# Patient Record
Sex: Male | Born: 1960 | Race: White | Hispanic: No | Marital: Married | Smoking: Former smoker
Health system: Southern US, Community
[De-identification: ages and names within clinical notes are randomized; demographics above are authoritative.]

## PROBLEM LIST (undated history)

## (undated) DIAGNOSIS — N289 Disorder of kidney and ureter, unspecified: Secondary | ICD-10-CM

## (undated) DIAGNOSIS — F1010 Alcohol abuse, uncomplicated: Secondary | ICD-10-CM

## (undated) DIAGNOSIS — I10 Essential (primary) hypertension: Secondary | ICD-10-CM

## (undated) DIAGNOSIS — K859 Acute pancreatitis without necrosis or infection, unspecified: Secondary | ICD-10-CM

## (undated) DIAGNOSIS — J189 Pneumonia, unspecified organism: Secondary | ICD-10-CM

## (undated) HISTORY — PX: OTHER SURGICAL HISTORY: SHX169

## (undated) HISTORY — PX: LITHOTRIPSY: SUR834

---

## 2000-04-08 ENCOUNTER — Ambulatory Visit (HOSPITAL_BASED_OUTPATIENT_CLINIC_OR_DEPARTMENT_OTHER): Admission: RE | Admit: 2000-04-08 | Discharge: 2000-04-08 | Payer: Self-pay | Admitting: Orthopedic Surgery

## 2001-08-17 ENCOUNTER — Ambulatory Visit (HOSPITAL_BASED_OUTPATIENT_CLINIC_OR_DEPARTMENT_OTHER): Admission: RE | Admit: 2001-08-17 | Discharge: 2001-08-17 | Payer: Self-pay | Admitting: Orthopedic Surgery

## 2003-08-23 ENCOUNTER — Ambulatory Visit (HOSPITAL_BASED_OUTPATIENT_CLINIC_OR_DEPARTMENT_OTHER): Admission: RE | Admit: 2003-08-23 | Discharge: 2003-08-23 | Payer: Self-pay | Admitting: Orthopedic Surgery

## 2011-08-03 ENCOUNTER — Inpatient Hospital Stay (HOSPITAL_COMMUNITY)
Admission: EM | Admit: 2011-08-03 | Discharge: 2011-08-07 | DRG: 204 | Disposition: A | Discharge status: Home / Self Care | Payer: BC Managed Care – PPO | Attending: Internal Medicine | Admitting: Internal Medicine

## 2011-08-03 ENCOUNTER — Encounter: Payer: Self-pay | Admitting: *Deleted

## 2011-08-03 DIAGNOSIS — N200 Calculus of kidney: Secondary | ICD-10-CM | POA: Diagnosis present

## 2011-08-03 DIAGNOSIS — Z23 Encounter for immunization: Secondary | ICD-10-CM

## 2011-08-03 DIAGNOSIS — F1021 Alcohol dependence, in remission: Secondary | ICD-10-CM | POA: Diagnosis present

## 2011-08-03 DIAGNOSIS — K859 Acute pancreatitis without necrosis or infection, unspecified: Principal | ICD-10-CM | POA: Diagnosis present

## 2011-08-03 DIAGNOSIS — R109 Unspecified abdominal pain: Secondary | ICD-10-CM | POA: Diagnosis present

## 2011-08-03 HISTORY — DX: Acute pancreatitis without necrosis or infection, unspecified: K85.90

## 2011-08-03 HISTORY — DX: Pneumonia, unspecified organism: J18.9

## 2011-08-03 HISTORY — DX: Disorder of kidney and ureter, unspecified: N28.9

## 2011-08-03 HISTORY — DX: Alcohol abuse, uncomplicated: F10.10

## 2011-08-03 HISTORY — DX: Essential (primary) hypertension: I10

## 2011-08-03 LAB — URINALYSIS, ROUTINE W REFLEX MICROSCOPIC
Bilirubin Urine: NEGATIVE
Glucose, UA: NEGATIVE mg/dL
Hgb urine dipstick: NEGATIVE
Protein, ur: NEGATIVE mg/dL
Urobilinogen, UA: 0.2 mg/dL (ref 0.0–1.0)

## 2011-08-03 LAB — DIFFERENTIAL
Basophils Absolute: 0.1 10*3/uL (ref 0.0–0.1)
Eosinophils Absolute: 0.1 10*3/uL (ref 0.0–0.7)
Eosinophils Relative: 1 % (ref 0–5)
Lymphs Abs: 2.2 10*3/uL (ref 0.7–4.0)
Monocytes Absolute: 0.6 10*3/uL (ref 0.1–1.0)

## 2011-08-03 LAB — BASIC METABOLIC PANEL
CO2: 26 mEq/L (ref 19–32)
Calcium: 9.3 mg/dL (ref 8.4–10.5)
Creatinine, Ser: 0.64 mg/dL (ref 0.50–1.35)
GFR calc non Af Amer: >90 mL/min (ref >90)
Glucose, Bld: 117 mg/dL — ABNORMAL HIGH (ref 70–99)
Sodium: 138 mEq/L (ref 135–145)

## 2011-08-03 LAB — LIPASE, BLOOD: Lipase: 97 U/L — ABNORMAL HIGH (ref 11–59)

## 2011-08-03 LAB — CBC
HCT: 37.7 % — ABNORMAL LOW (ref 39.0–52.0)
MCH: 35.1 pg — ABNORMAL HIGH (ref 26.0–34.0)
MCV: 105 fL — ABNORMAL HIGH (ref 78.0–100.0)
Platelets: 613 10*3/uL — ABNORMAL HIGH (ref 150–400)
RDW: 13 % (ref 11.5–15.5)

## 2011-08-03 LAB — HEPATIC FUNCTION PANEL
Albumin: 2.4 g/dL — ABNORMAL LOW (ref 3.5–5.2)
Alkaline Phosphatase: 114 U/L (ref 39–117)
Bilirubin, Direct: <0.1 mg/dL (ref 0.0–0.3)
Total Bilirubin: 0.1 mg/dL — ABNORMAL LOW (ref 0.3–1.2)

## 2011-08-03 LAB — ETHANOL: Alcohol, Ethyl (B): <11 mg/dL (ref 0–11)

## 2011-08-03 MED ORDER — THIAMINE HCL 100 MG/ML IJ SOLN
Freq: Once | INTRAVENOUS | Status: AC
  Administered 2011-08-04: 01:00:00 via INTRAVENOUS
  Filled 2011-08-03: qty 1000

## 2011-08-03 MED ORDER — SODIUM CHLORIDE 0.9 % IV BOLUS (SEPSIS)
1000.0000 mL | Freq: Once | INTRAVENOUS | Status: AC
  Administered 2011-08-03: 1000 mL via INTRAVENOUS

## 2011-08-03 MED ORDER — ENOXAPARIN SODIUM 40 MG/0.4ML ~~LOC~~ SOLN
40.0000 mg | Freq: Every day | SUBCUTANEOUS | Status: DC
  Administered 2011-08-04 – 2011-08-07 (×4): 40 mg via SUBCUTANEOUS
  Filled 2011-08-03 (×4): qty 0.4

## 2011-08-03 MED ORDER — HYDROMORPHONE HCL 1 MG/ML IJ SOLN
1.0000 mg | INTRAMUSCULAR | Status: DC | PRN
  Administered 2011-08-04 (×2): 2 mg via INTRAVENOUS
  Administered 2011-08-04: 1 mg via INTRAVENOUS
  Administered 2011-08-04 – 2011-08-05 (×6): 2 mg via INTRAVENOUS
  Administered 2011-08-05: 1 mg via INTRAVENOUS
  Administered 2011-08-06: 2 mg via INTRAVENOUS
  Administered 2011-08-06: 1 mg via INTRAVENOUS
  Administered 2011-08-06: 2 mg via INTRAVENOUS
  Administered 2011-08-07: 1 mg via INTRAVENOUS
  Administered 2011-08-07: 2 mg via INTRAVENOUS
  Administered 2011-08-07: 1 mg via INTRAVENOUS
  Filled 2011-08-03: qty 1
  Filled 2011-08-03 (×3): qty 2
  Filled 2011-08-03: qty 1
  Filled 2011-08-03: qty 2
  Filled 2011-08-03: qty 1
  Filled 2011-08-03: qty 2
  Filled 2011-08-03: qty 1
  Filled 2011-08-03 (×3): qty 2
  Filled 2011-08-03: qty 1
  Filled 2011-08-03 (×3): qty 2

## 2011-08-03 MED ORDER — SODIUM CHLORIDE 0.9 % IJ SOLN
INTRAMUSCULAR | Status: AC
  Administered 2011-08-03: 3 mL
  Filled 2011-08-03: qty 3

## 2011-08-03 MED ORDER — M.V.I. ADULT IV INJ
INJECTION | Freq: Once | INTRAVENOUS | Status: DC
  Administered 2011-08-03: 22:00:00 via INTRAVENOUS
  Filled 2011-08-03: qty 1000

## 2011-08-03 MED ORDER — DEXTROSE-NACL 5-0.9 % IV SOLN
INTRAVENOUS | Status: DC
  Administered 2011-08-04: via INTRAVENOUS

## 2011-08-03 MED ORDER — HYDROMORPHONE HCL 1 MG/ML IJ SOLN
1.0000 mg | Freq: Once | INTRAMUSCULAR | Status: AC
  Administered 2011-08-03: 1 mg via INTRAVENOUS
  Filled 2011-08-03: qty 1

## 2011-08-03 MED ORDER — SODIUM CHLORIDE 0.9 % IV SOLN
1.5000 g | Freq: Three times a day (TID) | INTRAVENOUS | Status: DC
  Administered 2011-08-04 (×2): 1.5 g via INTRAVENOUS
  Filled 2011-08-03 (×5): qty 1.5

## 2011-08-03 MED ORDER — PANTOPRAZOLE SODIUM 40 MG IV SOLR
40.0000 mg | Freq: Every day | INTRAVENOUS | Status: DC
  Administered 2011-08-04 – 2011-08-06 (×4): 40 mg via INTRAVENOUS
  Filled 2011-08-03 (×4): qty 40

## 2011-08-03 MED ORDER — SODIUM CHLORIDE 0.9 % IV SOLN
INTRAVENOUS | Status: DC
Start: 2011-08-03 — End: 2011-08-03
  Administered 2011-08-03: 20:00:00 via INTRAVENOUS

## 2011-08-03 MED ORDER — ONDANSETRON HCL 4 MG/2ML IJ SOLN
4.0000 mg | Freq: Once | INTRAMUSCULAR | Status: AC
  Administered 2011-08-03: 4 mg via INTRAVENOUS
  Filled 2011-08-03: qty 2

## 2011-08-03 MED ORDER — KETOROLAC TROMETHAMINE 30 MG/ML IJ SOLN
30.0000 mg | Freq: Once | INTRAMUSCULAR | Status: AC
  Administered 2011-08-03: 30 mg via INTRAVENOUS
  Filled 2011-08-03: qty 1

## 2011-08-03 NOTE — ED Notes (Signed)
Pt's sister states that pt was admitted x 3 wks ago for alcohol abuse and withdrawal.  Had been having hallucinations for which he is now taking medications for.

## 2011-08-03 NOTE — ED Notes (Signed)
Dx with kidney stone x 3-4 wks ago.  C/o left sided abd pain radiating to left side of bag, states thinks it's the kidney stone, states pain became worse today.  Denies GU sx, denies n/v/d.

## 2011-08-03 NOTE — ED Provider Notes (Signed)
History     CSN: [id number] Arrival date & time: 08/03/2011  6:54 PM  Chief Complaint  Patient presents with  . Abdominal Pain    (Consider location/radiation/quality/duration/timing/severity/associated sxs/prior treatment) HPI:  Patient recently discharged from hospital in [place] with pancreatitis, kidney stones, urinary infection. To be a heavy drinker but has not drank in a couple weeks. Level V caveat for urgent need for intervention. Anes of pain in left flank with radiation to the left abdomen and epigastric area. No fever or chills or dysuria  Past Medical History  Diagnosis Date  . Renal disorder     kidney stones  . Hypertension   . Pneumonia   . Pancreatitis   . Alcohol abuse     Past Surgical History  Procedure Date  . Lithotripsy   . Carpel tunnel     No family history on file.  History  Substance Use Topics  . Smoking status: Former Games developer  . Smokeless tobacco: Not on file  . Alcohol Use: No      Review of Systems  Unable to perform ROS: Other    Allergies  Review of patient's allergies indicates no known allergies.  Home Medications   Current Outpatient Rx  Name Route Sig Dispense Refill  . ALLOPURINOL 300 MG PO TABS Oral Take 300 mg by mouth daily.      . AMOXICILLIN-POT CLAVULANATE 1000-62.5 MG PO TB12 Oral Take 2 tablets by mouth 2 (two) times daily.      . CHLORDIAZEPOXIDE HCL 25 MG PO CAPS Oral Take 25 mg by mouth at bedtime.      Marland Kitchen LISINOPRIL-HYDROCHLOROTHIAZIDE 20-25 MG PO TABS Oral Take 2 tablets by mouth daily.      Marland Kitchen LORATADINE 10 MG PO TABS Oral Take 10 mg by mouth daily. For allergies     . METOPROLOL TARTRATE 25 MG PO TABS Oral Take 25 mg by mouth 2 (two) times daily.      Marland Kitchen OMEPRAZOLE 20 MG PO CPDR Oral Take 20 mg by mouth daily. On an empty stomach     . POTASSIUM CHLORIDE 10 MEQ PO TBCR Oral Take 10 mEq by mouth daily.      Marland Kitchen PROMETHAZINE HCL 25 MG PO TABS Oral Take 25 mg by mouth every 6 (six) hours as needed. For  nausea and vomiting: Take one tablet every 6 to 8 hours as needed for nausea and vomiting       BP 120/88  Pulse 78  Temp(Src) 98.2 F (36.8 C) (Oral)  Resp 16  Ht 5\' 10"  (1.778 m)  Wt 250 lb (113.399 kg)  BMI 35.87 kg/m2  SpO2 99%  Physical Exam  Nursing note and vitals reviewed. Constitutional: He is oriented to person, place, and time. He appears well-developed and well-nourished.  HENT:  Head: Normocephalic and atraumatic.  Eyes: Conjunctivae and EOM are normal. Pupils are equal, round, and reactive to light.  Neck: Normal range of motion. Neck supple.  Cardiovascular: Normal rate and regular rhythm.   Pulmonary/Chest: Effort normal and breath sounds normal.  Abdominal: Soft. Bowel sounds are normal.       Minimal tenderness left lower cautery and epigastrium  Genitourinary:       Minimal tenderness left flank  Musculoskeletal: Normal range of motion.  Neurological: He is alert and oriented to person, place, and time.  Skin: Skin is warm and dry.  Psychiatric: He has a normal mood and affect.    ED Course  Procedures (including critical care time)  Labs Reviewed  URINALYSIS, ROUTINE W REFLEX MICROSCOPIC - Abnormal; Notable for the following:    Color, Urine AMBER (*) BIOCHEMICALS MAY BE AFFECTED BY COLOR   All other components within normal limits  CBC - Abnormal; Notable for the following:    WBC 11.5 (*)    RBC 3.59 (*)    Hemoglobin 12.6 (*)    HCT 37.7 (*)    MCV 105.0 (*)    MCH 35.1 (*)    Platelets 613 (*)    All other components within normal limits  DIFFERENTIAL - Abnormal; Notable for the following:    Neutro Abs 8.5 (*)    All other components within normal limits  BASIC METABOLIC PANEL - Abnormal; Notable for the following:    Glucose, Bld 117 (*)    All other components within normal limits  HEPATIC FUNCTION PANEL - Abnormal; Notable for the following:    Albumin 2.4 (*)    AST 113 (*)    ALT 89 (*)    Total Bilirubin 0.1 (*)    All other  components within normal limits  LIPASE, BLOOD - Abnormal; Notable for the following:    Lipase 97 (*)    All other components within normal limits  ETHANOL   No results found.   1. Pancreatitis       MDM  Lipase is elevated. Patient has persistent pain. I opted not to do a CT scan tonight because of excessive radiation exposure. Will admit for pain control and hydration        Donnetta Hutching, MD 08/03/11 2155

## 2011-08-04 ENCOUNTER — Encounter (HOSPITAL_COMMUNITY): Payer: Self-pay

## 2011-08-04 LAB — CREATININE, SERUM: Creatinine, Ser: 0.65 mg/dL (ref 0.50–1.35)

## 2011-08-04 LAB — LIPASE, BLOOD: Lipase: 81 U/L — ABNORMAL HIGH (ref 11–59)

## 2011-08-04 LAB — CBC
Hemoglobin: 12.1 g/dL — ABNORMAL LOW (ref 13.0–17.0)
MCH: 35.7 pg — ABNORMAL HIGH (ref 26.0–34.0)
MCH: 34.8 pg — ABNORMAL HIGH (ref 26.0–34.0)
MCHC: 34 g/dL (ref 30.0–36.0)
MCHC: 32.4 g/dL (ref 30.0–36.0)
Platelets: 520 10*3/uL — ABNORMAL HIGH (ref 150–400)
Platelets: 540 10*3/uL — ABNORMAL HIGH (ref 150–400)
RDW: 13 % (ref 11.5–15.5)
RDW: 13.2 % (ref 11.5–15.5)

## 2011-08-04 LAB — BASIC METABOLIC PANEL
BUN: 18 mg/dL (ref 6–23)
Calcium: 9.1 mg/dL (ref 8.4–10.5)
Creatinine, Ser: 0.61 mg/dL (ref 0.50–1.35)
GFR calc non Af Amer: >90 mL/min (ref >90)
Glucose, Bld: 107 mg/dL — ABNORMAL HIGH (ref 70–99)

## 2011-08-04 MED ORDER — FOLIC ACID 5 MG/ML IJ SOLN
INTRAMUSCULAR | Status: AC
  Filled 2011-08-04: qty 0.2

## 2011-08-04 MED ORDER — INFLUENZA VAC TYP A&B SURF ANT IM INJ
0.5000 mL | INJECTION | Freq: Once | INTRAMUSCULAR | Status: DC
  Filled 2011-08-04: qty 0.5

## 2011-08-04 MED ORDER — SODIUM CHLORIDE 0.9 % IJ SOLN
INTRAMUSCULAR | Status: AC
  Administered 2011-08-04: 13:00:00
  Filled 2011-08-04: qty 10

## 2011-08-04 MED ORDER — THIAMINE HCL 100 MG/ML IJ SOLN
INTRAMUSCULAR | Status: AC
  Filled 2011-08-04: qty 2

## 2011-08-04 MED ORDER — KCL IN DEXTROSE-NACL 20-5-0.9 MEQ/L-%-% IV SOLN
INTRAVENOUS | Status: DC
  Administered 2011-08-04: 1000 mL via INTRAVENOUS
  Administered 2011-08-04 – 2011-08-07 (×5): via INTRAVENOUS

## 2011-08-04 MED ORDER — SODIUM CHLORIDE 0.9 % IJ SOLN
INTRAMUSCULAR | Status: AC
  Filled 2011-08-04: qty 10

## 2011-08-04 MED ORDER — INFLUENZA VIRUS VACC SPLIT PF IM SUSP
0.5000 mL | Freq: Once | INTRAMUSCULAR | Status: AC
  Administered 2011-08-05: 0.5 mL via INTRAMUSCULAR
  Filled 2011-08-04: qty 0.5

## 2011-08-04 MED ORDER — SODIUM CHLORIDE 0.9 % IJ SOLN
INTRAMUSCULAR | Status: AC
  Administered 2011-08-04: 10 mL
  Filled 2011-08-04: qty 10

## 2011-08-04 MED ORDER — M.V.I. ADULT IV INJ
INJECTION | INTRAVENOUS | Status: AC
  Filled 2011-08-04: qty 10

## 2011-08-04 MED ORDER — SODIUM CHLORIDE 0.9 % IV SOLN
INTRAVENOUS | Status: AC
  Filled 2011-08-04: qty 1.5

## 2011-08-04 MED ORDER — SODIUM CHLORIDE 0.9 % IJ SOLN
INTRAMUSCULAR | Status: AC
  Administered 2011-08-04: 02:00:00
  Filled 2011-08-04: qty 10

## 2011-08-04 NOTE — Progress Notes (Signed)
Skin assessment done by C Kassady Laboy RN and Nena Polio RN pt was found to have old bruise to left arm  And small scar to left flank  Upper rt arm scar is noted

## 2011-08-04 NOTE — H&P (Signed)
PCP: None.     Chief Complaint: Abdominal pain   HPI: Edwin Thompson is an 50 y.o. male with history of alcoholism along with a history of chronic recurrent pancreatitis, presents to the emergency room at [facility] complaining of abdominal pain and right flank pain. He denied any nausea, vomiting,  black stool,  bloody stool,  fever or chills. He was recently admitted to the hospital in [place] for pancreatitis,  kidney stone,  and UTI.   He adamantly denied that he had been drinking for at least 20 days.  He has no chest pain shortness of breath headache or any other symptomology. In the emergency room, he was given Dilaudid but because of the persistent of his pain hospitalists was asked to admit him.  Past Medical History  Diagnosis Date  . Renal disorder     kidney stones  . Hypertension   . Pneumonia   . Pancreatitis   . Alcohol abuse     Past Surgical History  Procedure Date  . Lithotripsy   . Carpel tunnel     Medications:  HOME MEDS: Prior to Admission medications   Medication Sig Start Date End Date Taking? Authorizing Provider  allopurinol (ZYLOPRIM) 300 MG tablet Take 300 mg by mouth daily.     Yes Historical Provider, MD  amoxicillin-clavulanate (AUGMENTIN XR) 1000-62.5 MG per tablet Take 2 tablets by mouth 2 (two) times daily.     Yes Historical Provider, MD  chlordiazePOXIDE (LIBRIUM) 25 MG capsule Take 25 mg by mouth at bedtime.     Yes Historical Provider, MD  lisinopril-hydrochlorothiazide (PRINZIDE,ZESTORETIC) 20-25 MG per tablet Take 2 tablets by mouth daily.     Yes Historical Provider, MD  loratadine (CLARITIN) 10 MG tablet Take 10 mg by mouth daily. For allergies    Yes Historical Provider, MD  metoprolol tartrate (LOPRESSOR) 25 MG tablet Take 25 mg by mouth 2 (two) times daily.     Yes Historical Provider, MD  omeprazole (PRILOSEC) 20 MG capsule Take 20 mg by mouth daily. On an empty stomach    Yes Historical Provider, MD  potassium chloride  (KLOR-CON) 10 MEQ CR tablet Take 10 mEq by mouth daily.     Yes Historical Provider, MD  promethazine (PHENERGAN) 25 MG tablet Take 25 mg by mouth every 6 (six) hours as needed. For nausea and vomiting: Take one tablet every 6 to 8 hours as needed for nausea and vomiting     Historical Provider, MD    PRIOR TO [facility] Medications Prior to Admission  Medication Dose Route Frequency Provider Last Rate Last Dose  . ampicillin-sulbactam (UNASYN) 1.5 g in sodium chloride 0.9 % 50 mL IVPB  1.5 g Intravenous Q8H Chritopher Coster   1.5 g at 08/04/11 0028  . dextrose 5 % and 0.9 % NaCl with KCl 20 mEq/L infusion   Intravenous Continuous [place] Siren      . dextrose 5 % and 0.9% NaCl 1,000 mL with thiamine 100 mg, folic acid 1 mg, multivitamins adult 10 mL infusion   Intravenous Once [place] Siren      . enoxaparin (LOVENOX) injection 40 mg  40 mg Subcutaneous Daily [place] Siren      . HYDROmorphone (DILAUDID) injection 1 mg  1 mg Intravenous Once Donnetta Hutching, MD   1 mg at 08/03/11 2019  . HYDROmorphone (DILAUDID) injection 1-2 mg  1-2 mg Intravenous Q3H PRN [place] Siren   1 mg at 08/04/11 0012  . ketorolac (TORADOL) 30 MG/ML  injection 30 mg  30 mg Intravenous Once Donnetta Hutching, MD   30 mg at 08/03/11 2019  . ondansetron (ZOFRAN) injection 4 mg  4 mg Intravenous Once Donnetta Hutching, MD   4 mg at 08/03/11 2019  . pantoprazole (PROTONIX) injection 40 mg  40 mg Intravenous QHS Houston Siren      . sodium chloride 0.9 % bolus 1,000 mL  1,000 mL Intravenous Once Donnetta Hutching, MD   1,000 mL at 08/03/11 2021  . sodium chloride 0.9 % bolus 1,000 mL  1,000 mL Intravenous Once Donnetta Hutching, MD   1,000 mL at 08/03/11 2021  . sodium chloride 0.9 % injection        3 mL at 08/03/11 2346  . DISCONTD: 0.9 %  sodium chloride infusion   Intravenous Continuous Donnetta Hutching, MD 125 mL/hr at 08/03/11 2015    . DISCONTD: dextrose 5 % and 0.9% NaCl 1,000 mL with potassium chloride 20 mEq infusion   Intravenous Continuous [place] Siren      . DISCONTD: sodium chloride  0.9 % 1,000 mL with thiamine 100 mg, folic acid 1 mg, multivitamins adult 10 mL infusion   Intravenous Once Donnetta Hutching, MD       No current outpatient prescriptions on file as of 08/04/2011.    Allergies:  No Known Allergies  Social History:   reports that he has quit smoking. He does not have any smokeless tobacco history on file. He reports that he does not drink alcohol or use illicit drugs.  Family History: No family history on file.  Rewiew of Systems:  The patient denies anorexia, fever, weight loss,, vision loss, decreased hearing, hoarseness, chest pain, syncope, dyspnea on exertion, peripheral edema, balance deficits, hemoptysis, melena, hematochezia, severe indigestion/heartburn, hematuria, incontinence, genital sores, muscle weakness, suspicious skin lesions, transient blindness, difficulty walking, depression, unusual weight change, abnormal bleeding, enlarged lymph nodes, angioedema, and breast masses.   Physical Exam: Filed Vitals:   08/03/11 1839 08/03/11 2141 08/03/11 2338  BP: 123/82 120/88 130/86  Pulse: 81 78 80  Temp: 98.2 F (36.8 C)  97.4 F (36.3 C)  TempSrc: Oral  Oral  Resp: 20 16 18   Height: 5\' 10"  (1.778 m)    Weight: 113.399 kg (250 lb)    SpO2: 98% 99% 94%   Blood pressure 130/86, pulse 80, temperature 97.4 F (36.3 C), temperature source Oral, resp. rate 18, height 5\' 10"  (1.778 m), weight 113.399 kg (250 lb), SpO2 94.00%.  GEN:  Pleasant person lying in the stretcher in no acute distress; cooperative with exam PSYCH: He is alert and oriented x4; does not appear anxious does not appear depressed; affect is normal HEENT: Mucous membranes pink and anicteric; PERRLA; EOM intact; no cervical lymphadenopathy nor thyromegaly or carotid bruit; no JVD; Breasts:: Not examined CHEST WALL: No tenderness CHEST: Normal respiration, clear to auscultation bilaterally HEART: Regular rate and rhythm; no murmurs rubs or gallops BACK: No kyphosis or scoliosis; no  CVA tenderness ABDOMEN: Obese, tender in the epigastic arear; no masses, no organomegaly, normal abdominal bowel sounds; no pannus; no intertriginous candida. Rectal Exam: Not done EXTREMITIES: No bone or joint deformity; age-appropriate arthropathy of the hands and knees; no edema; no ulcerations. Genitalia: not examined PULSES: 2+ and symmetric SKIN: Normal hydration no rash or ulceration CNS: Cranial nerves 2-12 grossly intact no focal neurologic deficit   Labs & Imaging Results for orders placed during the hospital encounter of 08/03/11 (from the past 48 hour(s))  URINALYSIS, ROUTINE W REFLEX MICROSCOPIC  Status: Abnormal   Collection Time   08/03/11  7:36 PM      Component Value Range Comment   Color, Urine AMBER (*) YELLOW  BIOCHEMICALS MAY BE AFFECTED BY COLOR   Appearance CLEAR  CLEAR     Specific Gravity, Urine 1.020  1.005 - 1.030     pH 6.0  5.0 - 8.0     Glucose, UA NEGATIVE  NEGATIVE (mg/dL)    Hgb urine dipstick NEGATIVE  NEGATIVE     Bilirubin Urine NEGATIVE  NEGATIVE     Ketones, ur NEGATIVE  NEGATIVE (mg/dL)    Protein, ur NEGATIVE  NEGATIVE (mg/dL)    Urobilinogen, UA 0.2  0.0 - 1.0 (mg/dL)    Nitrite NEGATIVE  NEGATIVE     Leukocytes, UA NEGATIVE  NEGATIVE  MICROSCOPIC NOT DONE ON URINES WITH NEGATIVE PROTEIN, BLOOD, LEUKOCYTES, NITRITE, OR GLUCOSE <1000 mg/dL.  CBC     Status: Abnormal   Collection Time   08/03/11  7:45 PM      Component Value Range Comment   WBC 11.5 (*) 4.0 - 10.5 (K/uL)    RBC 3.59 (*) 4.22 - 5.81 (MIL/uL)    Hemoglobin 12.6 (*) 13.0 - 17.0 (g/dL)    HCT 16.1 (*) 09.6 - 52.0 (%)    MCV 105.0 (*) 78.0 - 100.0 (fL)    MCH 35.1 (*) 26.0 - 34.0 (pg)    MCHC 33.4  30.0 - 36.0 (g/dL)    RDW 04.5  40.9 - 81.1 (%)    Platelets 613 (*) 150 - 400 (K/uL)   DIFFERENTIAL     Status: Abnormal   Collection Time   08/03/11  7:45 PM      Component Value Range Comment   Neutrophils Relative 74  43 - 77 (%)    Neutro Abs 8.5 (*) 1.7 - 7.7 (K/uL)      Lymphocytes Relative 19  12 - 46 (%)    Lymphs Abs 2.2  0.7 - 4.0 (K/uL)    Monocytes Relative 5  3 - 12 (%)    Monocytes Absolute 0.6  0.1 - 1.0 (K/uL)    Eosinophils Relative 1  0 - 5 (%)    Eosinophils Absolute 0.1  0.0 - 0.7 (K/uL)    Basophils Relative 1  0 - 1 (%)    Basophils Absolute 0.1  0.0 - 0.1 (K/uL)   BASIC METABOLIC PANEL     Status: Abnormal   Collection Time   08/03/11  7:45 PM      Component Value Range Comment   Sodium 138  135 - 145 (mEq/L)    Potassium 4.0  3.5 - 5.1 (mEq/L)    Chloride 102  96 - 112 (mEq/L)    CO2 26  19 - 32 (mEq/L)    Glucose, Bld 117 (*) 70 - 99 (mg/dL)    BUN 15  6 - 23 (mg/dL)    Creatinine, Ser 9.14  0.50 - 1.35 (mg/dL)    Calcium 9.3  8.4 - 10.5 (mg/dL)    GFR calc non Af Amer >90  >90 (mL/min)    GFR calc Af Amer >90  >90 (mL/min)   HEPATIC FUNCTION PANEL     Status: Abnormal   Collection Time   08/03/11  8:08 PM      Component Value Range Comment   Total Protein 6.1  6.0 - 8.3 (g/dL)    Albumin 2.4 (*) 3.5 - 5.2 (g/dL)    AST 782 (*) 0 -  37 (U/L)    ALT 89 (*) 0 - 53 (U/L)    Alkaline Phosphatase 114  39 - 117 (U/L)    Total Bilirubin 0.1 (*) 0.3 - 1.2 (mg/dL)    Bilirubin, Direct <1.6  0.0 - 0.3 (mg/dL)    Indirect Bilirubin NOT CALCULATED  0.3 - 0.9 (mg/dL)   LIPASE, BLOOD     Status: Abnormal   Collection Time   08/03/11  8:08 PM      Component Value Range Comment   Lipase 97 (*) 11 - 59 (U/L)   ETHANOL     Status: Normal   Collection Time   08/03/11  8:08 PM      Component Value Range Comment   Alcohol, Ethyl (B) <11  0 - 11 (mg/dL)    No results found.    Assessment Present on Admission:  .Abdominal pain .Alcoholism in remission .Nephrolithiasis .Pancreatitis   PLAN: #1 abdominal pain: Will admit patient to the hospital. He was placed n.p.o.,   He be given intravenous fluid with dextrose along with potassium.  I will give him Dilaudid for pain control.  #2 alcoholism:   He is actually doing quite well  and has not drop for 20 days. We'll give him thiamine just to be sure, but he is at low risk for withdrawal.  #3 nephrolithiasis:   Will continue him on Unasyn for his presumed UTI.  #4 pancreatitis his pain level is now very high I suspect that with n.p.o. and lauded that he should do well. Will repeat another lipase in the morning. He is stable otherwise.  Other plans as per orders  Crislyn Willbanks 08/04/2011, 12:35 AM

## 2011-08-04 NOTE — Progress Notes (Signed)
Subjective: Feels better, abd pain improved, no nausea or vomitting  Objective: Vital signs in last 24 hours: Temp:  [97.4 F (36.3 C)-98.2 F (36.8 C)] 98 F (36.7 C) (10/16 1610) Pulse Rate:  [78-92] 92  (10/16 0638) Resp:  [16-20] 20  (10/16 0638) BP: (101-130)/(64-88) 101/64 mmHg (10/16 0638) SpO2:  [94 %-99 %] 94 % (10/16 0638) Weight:  [113.399 kg (250 lb)-116.03 kg (255 lb 12.8 oz)] 255 lb 12.8 oz (116.03 kg) (10/16 0300) Weight change:  Last BM Date: 08/03/11  Intake/Output from previous day: 10/15 0701 - 10/16 0700 In: 0  Out: 275 [Urine:275]     Physical Exam: General: Alert, awake, oriented x3, in no acute distress. HEENT: No bruits, no goiter. Heart: Regular rate and rhythm, without murmurs, rubs, gallops. Lungs: Clear to auscultation bilaterally. Abdomen: Soft, nontender, nondistended, positive bowel sounds. Extremities: No clubbing cyanosis or edema with positive pedal pulses. Neuro: Grossly intact, nonfocal.    Lab Results: Basic Metabolic Panel:  Basename 08/04/11 0557 08/04/11 0021 08/03/11 1945  NA 140 -- 138  K 4.2 -- 4.0  CL 104 -- 102  CO2 28 -- 26  GLUCOSE 107* -- 117*  BUN 18 -- 15  CREATININE 0.61 0.65 --  CALCIUM 9.1 -- 9.3  MG -- -- --  PHOS -- -- --   Liver Function Tests:  [facility] 08/03/11 2008  AST 113*  ALT 89*  ALKPHOS 114  BILITOT 0.1*  PROT 6.1  ALBUMIN 2.4*    Basename 08/04/11 0557 08/03/11 2008  LIPASE 81* 97*  AMYLASE -- --   No results found for this basename: AMMONIA:2 in the last 72 hours CBC:  Basename 08/04/11 0557 08/04/11 0021 08/03/11 1945  WBC 11.0* 10.7* --  NEUTROABS -- -- 8.5*  HGB 12.1* 11.7* --  HCT 37.3* 34.4* --  MCV 107.2* 104.9* --  PLT 540* 520* --   Cardiac Enzymes: No results found for this basename: CKTOTAL:3,CKMB:3,CKMBINDEX:3,TROPONINI:3 in the last 72 hours BNP: No results found for this basename: POCBNP:3 in the last 72 hours D-Dimer: No results found for this basename:  DDIMER:2 in the last 72 hours CBG: No results found for this basename: GLUCAP:6 in the last 72 hours Hemoglobin A1C: No results found for this basename: HGBA1C in the last 72 hours Fasting Lipid Panel: No results found for this basename: CHOL,HDL,LDLCALC,TRIG,CHOLHDL,LDLDIRECT in the last 72 hours Thyroid Function Tests: No results found for this basename: TSH,T4TOTAL,FREET4,T3FREE,THYROIDAB in the last 72 hours   Basename 08/03/11 2008  ETH <11   Studies/Results: No results found.  Medications: Scheduled Meds:   . ampicillin-sulbactam (UNASYN) IV  1.5 g Intravenous Q8H  . general admission iv infusion   Intravenous Once  . enoxaparin  40 mg Subcutaneous Daily  .  HYDROmorphone (DILAUDID) injection  1 mg Intravenous Once  . influenza  inactive virus vaccine  0.5 mL Intramuscular Once  . ketorolac  30 mg Intravenous Once  . ondansetron  4 mg Intravenous Once  . pantoprazole (PROTONIX) IV  40 mg Intravenous QHS  . sodium chloride  1,000 mL Intravenous Once  . sodium chloride  1,000 mL Intravenous Once  . sodium chloride      . sodium chloride      . sodium chloride      . DISCONTD: influenza (>/= 3 years) inactive virus vaccine  0.5 mL Intramuscular Once  . DISCONTD: general admission iv infusion   Intravenous Once   Continuous Infusions:   . dextrose 5 % and 0.9 % NaCl  with KCl 20 mEq/L 100 mL/hr at 08/04/11 0015  . DISCONTD: sodium chloride 125 mL/hr at 08/03/11 2015  . DISCONTD: dextrose 5 % and 0.9% NaCl 1,000 mL with potassium chloride 20 mEq infusion     PRN Meds:.HYDROmorphone  Assessment/Plan:   1.Abdominal pain due to pancreatitis, resolved, start clear liquids, cont IVF and pain management   Alcoholism in remission, no signs of withdrawal, counselled  Nephrolithiasis  Pancreatitis, improving    LOS: 1 day   Latrelle Fuston 08/04/2011, 12:01 PM

## 2011-08-05 ENCOUNTER — Inpatient Hospital Stay (HOSPITAL_COMMUNITY): Payer: BC Managed Care – PPO

## 2011-08-05 LAB — CBC
HCT: 38.3 % — ABNORMAL LOW (ref 39.0–52.0)
MCHC: 32.4 g/dL (ref 30.0–36.0)
Platelets: 352 10*3/uL (ref 150–400)
RDW: 13.1 % (ref 11.5–15.5)

## 2011-08-05 MED ORDER — OXYCODONE-ACETAMINOPHEN 5-325 MG PO TABS
1.0000 | ORAL_TABLET | ORAL | Status: DC | PRN
  Administered 2011-08-05 – 2011-08-06 (×2): 2 via ORAL
  Administered 2011-08-06: 1 via ORAL
  Administered 2011-08-06 – 2011-08-07 (×6): 2 via ORAL
  Filled 2011-08-05 (×9): qty 2

## 2011-08-05 MED ORDER — SODIUM CHLORIDE 0.9 % IJ SOLN
INTRAMUSCULAR | Status: AC
  Administered 2011-08-05: 10 mL
  Filled 2011-08-05: qty 10

## 2011-08-05 NOTE — Progress Notes (Signed)
Subjective: Feels better, abd pain improved, no nausea or vomiting. wants to eat  Objective: Vital signs in last 24 hours: Temp:  [98 F (36.7 C)] 98 F (36.7 C) (10/17 0634) Pulse Rate:  [95-102] 95  (10/17 0634) Resp:  [20-22] 20  (10/17 0634) BP: (128-144)/(83-91) 144/91 mmHg (10/17 0634) SpO2:  [90 %] 90 % (10/17 0634) Weight change:  Last BM Date: 08/03/11  Intake/Output from previous day: 10/16 0701 - 10/17 0700 In: -  Out: 900 [Urine:900]     Physical Exam: General: Alert, awake, oriented x3, in no acute distress. HEENT: No bruits, no goiter. Heart: Regular rate and rhythm, without murmurs, rubs, gallops. Lungs: Clear to auscultation bilaterally. Abdomen: Soft, nontender, nondistended, positive bowel sounds. Extremities: No clubbing cyanosis or edema with positive pedal pulses. Neuro: Grossly intact, nonfocal.    Lab Results: Basic Metabolic Panel:  Basename 08/04/11 0557 08/04/11 0021 08/03/11 1945  NA 140 -- 138  K 4.2 -- 4.0  CL 104 -- 102  CO2 28 -- 26  GLUCOSE 107* -- 117*  BUN 18 -- 15  CREATININE 0.61 0.65 --  CALCIUM 9.1 -- 9.3  MG -- -- --  PHOS -- -- --   Liver Function Tests:  [facility] 08/03/11 2008  AST 113*  ALT 89*  ALKPHOS 114  BILITOT 0.1*  PROT 6.1  ALBUMIN 2.4*    Basename 08/04/11 0557 08/03/11 2008  LIPASE 81* 97*  AMYLASE -- --   No results found for this basename: AMMONIA:2 in the last 72 hours CBC:  Basename 08/05/11 0509 08/04/11 0557 08/03/11 1945  WBC 11.6* 11.0* --  NEUTROABS -- -- 8.5*  HGB 12.4* 12.1* --  HCT 38.3* 37.3* --  MCV 108.5* 107.2* --  PLT 352 540* --   Cardiac Enzymes: No results found for this basename: CKTOTAL:3,CKMB:3,CKMBINDEX:3,TROPONINI:3 in the last 72 hours BNP: No results found for this basename: POCBNP:3 in the last 72 hours D-Dimer: No results found for this basename: DDIMER:2 in the last 72 hours CBG: No results found for this basename: GLUCAP:6 in the last 72  hours Hemoglobin A1C: No results found for this basename: HGBA1C in the last 72 hours Fasting Lipid Panel: No results found for this basename: CHOL,HDL,LDLCALC,TRIG,CHOLHDL,LDLDIRECT in the last 72 hours Thyroid Function Tests: No results found for this basename: TSH,T4TOTAL,FREET4,T3FREE,THYROIDAB in the last 72 hours   Basename 08/03/11 2008  ETH <11   Studies/Results: No results found.  Medications: Scheduled Meds:    . enoxaparin  40 mg Subcutaneous Daily  . influenza  inactive virus vaccine  0.5 mL Intramuscular Once  . pantoprazole (PROTONIX) IV  40 mg Intravenous QHS  . sodium chloride       Continuous Infusions:    . dextrose 5 % and 0.9 % NaCl with KCl 20 mEq/L 100 mL/hr at 08/04/11 2241   PRN Meds:.HYDROmorphone  Assessment/Plan:  Abdominal pain due to pancreatitis resolved,cont IVF and pain management the patient on clear liquids. They'll advance his diet to full liquids and further to its regular food if he tolerates it patient also does have a recent hospitalization in [place] for acute pancreatitis will obtain the CT scan as well as the ultrasound done over there. He said the reason for the acute pancreatitis was alcohol. But he's not been drinking for the past 5 weeks. I will check his triglyceride level. I'll order ultrasound unlikely to have biliary stones because of the normal alkaline phosphatase.   Alcoholism  in remission, no signs of withdrawal, counseled  Questionable UTI: Was started on Unasyn because of questionable UTI patient urinalysis did not show any pus cells and all discontinued the Unasyn if patient spikes a temperature I will reculture and restart antibiotics    LOS: 2 days   Eben Choinski A 08/05/2011, 3:17 PM

## 2011-08-06 ENCOUNTER — Inpatient Hospital Stay (HOSPITAL_COMMUNITY): Payer: BC Managed Care – PPO

## 2011-08-06 LAB — LIPID PANEL
HDL: 30 mg/dL — ABNORMAL LOW (ref >39)
LDL Cholesterol: 70 mg/dL (ref 0–99)
Total CHOL/HDL Ratio: 4.3 RATIO
Triglycerides: 140 mg/dL (ref <150)

## 2011-08-06 LAB — COMPREHENSIVE METABOLIC PANEL
Albumin: 2.2 g/dL — ABNORMAL LOW (ref 3.5–5.2)
Alkaline Phosphatase: 77 U/L (ref 39–117)
BUN: 6 mg/dL (ref 6–23)
Potassium: 4.5 mEq/L (ref 3.5–5.1)
Total Protein: 5.1 g/dL — ABNORMAL LOW (ref 6.0–8.3)

## 2011-08-06 LAB — LIPASE, BLOOD: Lipase: 52 U/L (ref 11–59)

## 2011-08-06 MED ORDER — SODIUM CHLORIDE 0.9 % IJ SOLN
INTRAMUSCULAR | Status: AC
  Administered 2011-08-06: 10 mL
  Filled 2011-08-06: qty 10

## 2011-08-06 MED ORDER — POLYETHYLENE GLYCOL 3350 17 G PO PACK
17.0000 g | PACK | Freq: Every day | ORAL | Status: DC
  Administered 2011-08-06 – 2011-08-07 (×2): 17 g via ORAL
  Filled 2011-08-06 (×2): qty 1

## 2011-08-06 NOTE — Progress Notes (Signed)
Subjective: Feels better, no abdominal pain. Patient on full liquids. Did have some nausea and a small amount of vomiting. But he still wants to try solid food. No BM since the 15th  Objective: Vital signs in last 24 hours: Temp:  [97.4 F (36.3 C)-98 F (36.7 C)] 97.4 F (36.3 C) (10/18 0547) Pulse Rate:  [83-91] 83  (10/18 0547) Resp:  [18-20] 20  (10/18 0547) BP: (130-146)/(89-96) 146/96 mmHg (10/18 0547) SpO2:  [90 %-95 %] 95 % (10/18 0547) Weight change:  Last BM Date: 08/03/11  Intake/Output from previous day: 10/17 0701 - 10/18 0700 In: 6208.3 [P.O.:840; I.V.:5368.3] Out: 900 [Urine:900]     Physical Exam: General: Alert, awake, oriented x3, in no acute distress. HEENT: No bruits, no goiter. Heart: Regular rate and rhythm, without murmurs, rubs, gallops. Lungs: Clear to auscultation bilaterally. Abdomen: Soft, nontender, nondistended, positive bowel sounds. Extremities: No clubbing cyanosis or edema with positive pedal pulses. Neuro: Grossly intact, nonfocal.    Lab Results: Basic Metabolic Panel:  Basename 08/06/11 0500 08/04/11 0557  NA 137 140  K 4.5 4.2  CL 98 104  CO2 32 28  GLUCOSE 115* 107*  BUN 6 18  CREATININE 0.56 0.61  CALCIUM 9.6 9.1  MG -- --  PHOS -- --   Liver Function Tests:  Basename 08/06/11 0500 08/03/11 2008  AST 70* 113*  ALT 85* 89*  ALKPHOS 77 114  BILITOT 0.3 0.1*  PROT 5.1* 6.1  ALBUMIN 2.2* 2.4*    Basename 08/06/11 0500 08/04/11 0557  LIPASE 52 81*  AMYLASE -- --   No results found for this basename: AMMONIA:2 in the last 72 hours CBC:  Basename 08/05/11 0509 08/04/11 0557 08/03/11 1945  WBC 11.6* 11.0* --  NEUTROABS -- -- 8.5*  HGB 12.4* 12.1* --  HCT 38.3* 37.3* --  MCV 108.5* 107.2* --  PLT 352 540* --   Cardiac Enzymes: No results found for this basename: CKTOTAL:3,CKMB:3,CKMBINDEX:3,TROPONINI:3 in the last 72 hours BNP: No results found for this basename: POCBNP:3 in the last 72 hours D-Dimer: No  results found for this basename: DDIMER:2 in the last 72 hours CBG: No results found for this basename: GLUCAP:6 in the last 72 hours Hemoglobin A1C: No results found for this basename: HGBA1C in the last 72 hours Fasting Lipid Panel:  Basename 08/06/11 0500  CHOL 128  HDL 30*  LDLCALC 70  TRIG 161  CHOLHDL 4.3  LDLDIRECT --   Thyroid Function Tests: No results found for this basename: TSH,T4TOTAL,FREET4,T3FREE,THYROIDAB in the last 72 hours   Basename 08/03/11 2008  ETH <11   Studies/Results: US Abdomen Complete  08/05/2011  *RADIOLOGY REPORT*  Clinical Data:  Pancreatitis, evaluate for cholelithiasis  COMPLETE ABDOMINAL ULTRASOUND  Comparison:  None.  Findings:  Gallbladder:  No gallstones, gallbladder wall thickening, or pericholecystic fluid.  Negative sonographic Murphy's sign.  Common bile duct:  Measures 7 mm, at the upper limits of normal.  Liver:  Coarse, hyperechoic hepatic parenchyma, possibly reflecting hepatic steatosis.  IVC:  Poorly visualized due to overlying bowel gas.  Pancreas:  Not visualized due to overlying bowel gas.  Spleen:  Measures 6 cm.  Right Kidney:  Measures 13.5 cm.  No mass or hydronephrosis.  Left Kidney:  Measures 11.5 cm.  No mass or hydronephrosis.  Abdominal aorta:  Not visualized due to overlying bowel gas.  IMPRESSION: No gallstones are seen.  Common bile duct measures 7 mm, at the upper limits of normal.  Possible hepatic steatosis.  Original Report  Authenticated By: Charline Bills, M.D.    Medications: Scheduled Meds:    . enoxaparin  40 mg Subcutaneous Daily  . pantoprazole (PROTONIX) IV  40 mg Intravenous QHS  . sodium chloride       Continuous Infusions:    . dextrose 5 % and 0.9 % NaCl with KCl 20 mEq/L 100 mL/hr at 08/06/11 0605   PRN Meds:.HYDROmorphone, oxyCODONE-acetaminophen  Assessment/Plan:  Abdominal pain due to pancreatitis Patient is on full liquids has normal lipase. His ultrasound showed no evidence of  cholelithiasis or a CBD dilatation. I will obtain an x-ray I will advance his diet to solids as he wants to eat. Acute pancreatitis likely secondary to alcohol. Patient's ultrasound shows no cholelithiasis and there is no CBD dilatation. His triglyceride level is normal   Alcoholism  in remission, no signs of withdrawal, counseled  Questionable UTI: Was started on Unasyn because of questionable UTI patient urinalysis did not show any pus cells and all discontinued the Unasyn if patient spikes a temperature I will reculture and restart antibiotics  Obesity: This is stable patient is being counseled   LOS: 3 days   Edwin Thompson A 08/06/2011, 11:17 AM

## 2011-08-07 MED ORDER — ONDANSETRON HCL 4 MG PO TABS: 4.0000 mg | ORAL_TABLET | Freq: Four times a day (QID) | ORAL | Status: DC | PRN

## 2011-08-07 MED ORDER — SODIUM CHLORIDE 0.9 % IJ SOLN
INTRAMUSCULAR | Status: AC
  Administered 2011-08-07: 10 mL
  Filled 2011-08-07: qty 10

## 2011-08-07 MED ORDER — MAGNESIUM HYDROXIDE 400 MG/5ML PO SUSP
30.0000 mL | Freq: Once | ORAL | Status: AC
  Administered 2011-08-07: 30 mL via ORAL
  Filled 2011-08-07: qty 30

## 2011-08-07 MED ORDER — ONDANSETRON HCL 4 MG/2ML IJ SOLN
4.0000 mg | Freq: Four times a day (QID) | INTRAMUSCULAR | Status: DC | PRN
  Administered 2011-08-07: 4 mg via INTRAVENOUS
  Filled 2011-08-07: qty 2

## 2011-08-07 MED ORDER — OXYCODONE-ACETAMINOPHEN 5-325 MG PO TABS: 1.0000 | ORAL_TABLET | ORAL | Status: AC | PRN

## 2011-08-07 NOTE — Progress Notes (Signed)
UR Chart Review Completed  

## 2011-08-07 NOTE — Discharge Summary (Signed)
DISCHARGE SUMMARY  Edwin Thompson  MR#: [id number]  DOB:[birth year]  Date of Admission: 08/03/2011 Date of Discharge: 08/07/2011  Attending Physician:Jaime Grizzell A  Patient's PCP: Wyvonnia Lora  Consults: None  Discharge Diagnoses: .Acute pancreatitis .Abdominal pain .Alcoholism in remission .Nephrolithiasis     Current Discharge Medication List    START taking these medications   Details  oxyCODONE-acetaminophen (PERCOCET) 5-325 MG per tablet Take 1-2 tablets by mouth every 4 (four) hours as needed. Qty: 30 tablet, Refills: 0      CONTINUE these medications which have NOT CHANGED   Details  allopurinol (ZYLOPRIM) 300 MG tablet Take 300 mg by mouth daily.      amoxicillin-clavulanate (AUGMENTIN XR) 1000-62.5 MG per tablet Take 2 tablets by mouth 2 (two) times daily.      chlordiazePOXIDE (LIBRIUM) 25 MG capsule Take 25 mg by mouth at bedtime.      lisinopril-hydrochlorothiazide (PRINZIDE,ZESTORETIC) 20-25 MG per tablet Take 2 tablets by mouth daily.      loratadine (CLARITIN) 10 MG tablet Take 10 mg by mouth daily. For allergies     metoprolol tartrate (LOPRESSOR) 25 MG tablet Take 25 mg by mouth 2 (two) times daily.      omeprazole (PRILOSEC) 20 MG capsule Take 20 mg by mouth daily. On an empty stomach       STOP taking these medications     potassium chloride (KLOR-CON) 10 MEQ CR tablet      promethazine (PHENERGAN) 25 MG tablet        Brief Hospital Course:  Edwin Thompson is an 50 y.o. male with history of alcoholism along with a history of chronic recurrent pancreatitis, presents to the emergency room at [facility] complaining of abdominal pain and right flank pain. He denied any nausea, vomiting, black stool, bloody stool, fever or chills. He was recently admitted to the hospital in [place] for pancreatitis, kidney stone, and UTI. He adamantly denied that he had been drinking for at least 20 days. He has no chest pain shortness of breath headache  or any other symptomology. In the emergency room, he was given Dilaudid but because of the persistent of his pain hospitalists was asked to admit him.    Hospital Course: .Acute pancreatitis Patient admitted to the hospital and was put as n.p.o. Aggressive hydration with IV fluids and this too did as well as pain control with IV narcotics. Patient was improving and his diet was upgraded to slowly from n.p.o. to clear liquids then full up with that last solids. Patient tolerated that okay and his pancreatitis was resolving clinically and clinically, radiologically and biochemically. His lipase went down from a 97 on admission to normal level of 51 At time of discharge. Patient does have a transaminitis. Ultrasound was ordered. Did not have any gallstone or common bile duct dilatation. Because of the pancreatitis is likely alcohol patient does not have gallstones as triglyceride level is 140.  Marland KitchenAbdominal pain Secondary to acute pancreatitis please see above assessment.   .Alcoholism in remission Patient counseled on alcohol drinking extensively and he voiced understanding that his drinking closed his pancreatitis.   .Nephrolithiasis Patient does have left-sided kidney stone measures about 11x7 mm. It's obviously nonobstructing. CT scan of abdomen and pelvis are obtained from [facility] in [place]. Which evaluated the stone to be 9x14 mm nonobstructing stone without evidence of hydronephrosis. Patient probably will need referral to urologist as outpatient.   Please note that the patient did have recent admission to [facility] in [place] with acute pancreatitis, UTI and Altered mental status secondary to alcohol withdrawal.   Day of Discharge BP 137/93  Pulse 83  Temp(Src) 98.1 F (36.7 C) (Oral)  Resp 18  Ht 5\' 10"  (1.778 m)  Wt 116.03 kg (255 lb 12.8 oz)  BMI 36.70 kg/m2  SpO2 91%  Physical Exam: GEN: Pleasant person lying in the stretcher in no  acute distress; cooperative with exam  PSYCH: He is alert and oriented x4; does not appear anxious does not appear depressed; affect is normal  HEENT: Mucous membranes pink and anicteric; PERRLA; EOM intact; no cervical lymphadenopathy nor thyromegaly or carotid bruit; no JVD;  CHEST: Normal respiration, clear to auscultation bilaterally  HEART: Regular rate and rhythm; no murmurs rubs or gallops  ABDOMEN: Obese, tender in the epigastic arear; no masses, no organomegaly, normal abdominal bowel sounds; has a pannus; no intertriginous candida.  EXTREMITIES: No bone or joint deformity; age-appropriate arthropathy of the hands and knees; no edema; no ulcerations.  PULSES: 2+ and symmetric  SKIN: Normal hydration no rash or ulceration  CNS: Cranial nerves 2-12 grossly intact no focal neurologic deficit    Disposition: Home Follow-up Appts:Follow-up with Dr. Algis Downs.Tapper     SignedClydia Llano A 08/07/2011, 2:54 PM

## 2011-08-07 NOTE — Progress Notes (Signed)
PIV removed without complaint, patient discharged home. Patient verbalizes understanding of discharge instructions, follow up appointments and prescriptions. Patient escorted out by staff, transported by family.

## 2013-06-15 ENCOUNTER — Ambulatory Visit (HOSPITAL_COMMUNITY)
Admission: RE | Admit: 2013-06-15 | Discharge: 2013-06-15 | Disposition: A | Discharge status: Home / Self Care | Payer: BC Managed Care – PPO | Source: Ambulatory Visit | Attending: Family Medicine | Admitting: Family Medicine

## 2013-06-15 ENCOUNTER — Other Ambulatory Visit (HOSPITAL_COMMUNITY): Payer: Self-pay | Admitting: Family Medicine

## 2013-06-15 DIAGNOSIS — M25569 Pain in unspecified knee: Secondary | ICD-10-CM | POA: Insufficient documentation

## 2013-06-15 DIAGNOSIS — IMO0002 Reserved for concepts with insufficient information to code with codable children: Secondary | ICD-10-CM | POA: Insufficient documentation

## 2013-06-15 DIAGNOSIS — M1710 Unilateral primary osteoarthritis, unspecified knee: Secondary | ICD-10-CM | POA: Insufficient documentation

## 2013-06-15 DIAGNOSIS — M25562 Pain in left knee: Secondary | ICD-10-CM

## 2014-11-21 IMAGING — CR DG KNEE COMPLETE 4+V*L*
4 series · 4 of 4 positions shown · non-contrast
Comparison: None.

CLINICAL DATA: Left knee pain

LEFT KNEE - COMPLETE 4+ VIEW

[view not recorded (1 of 4)]
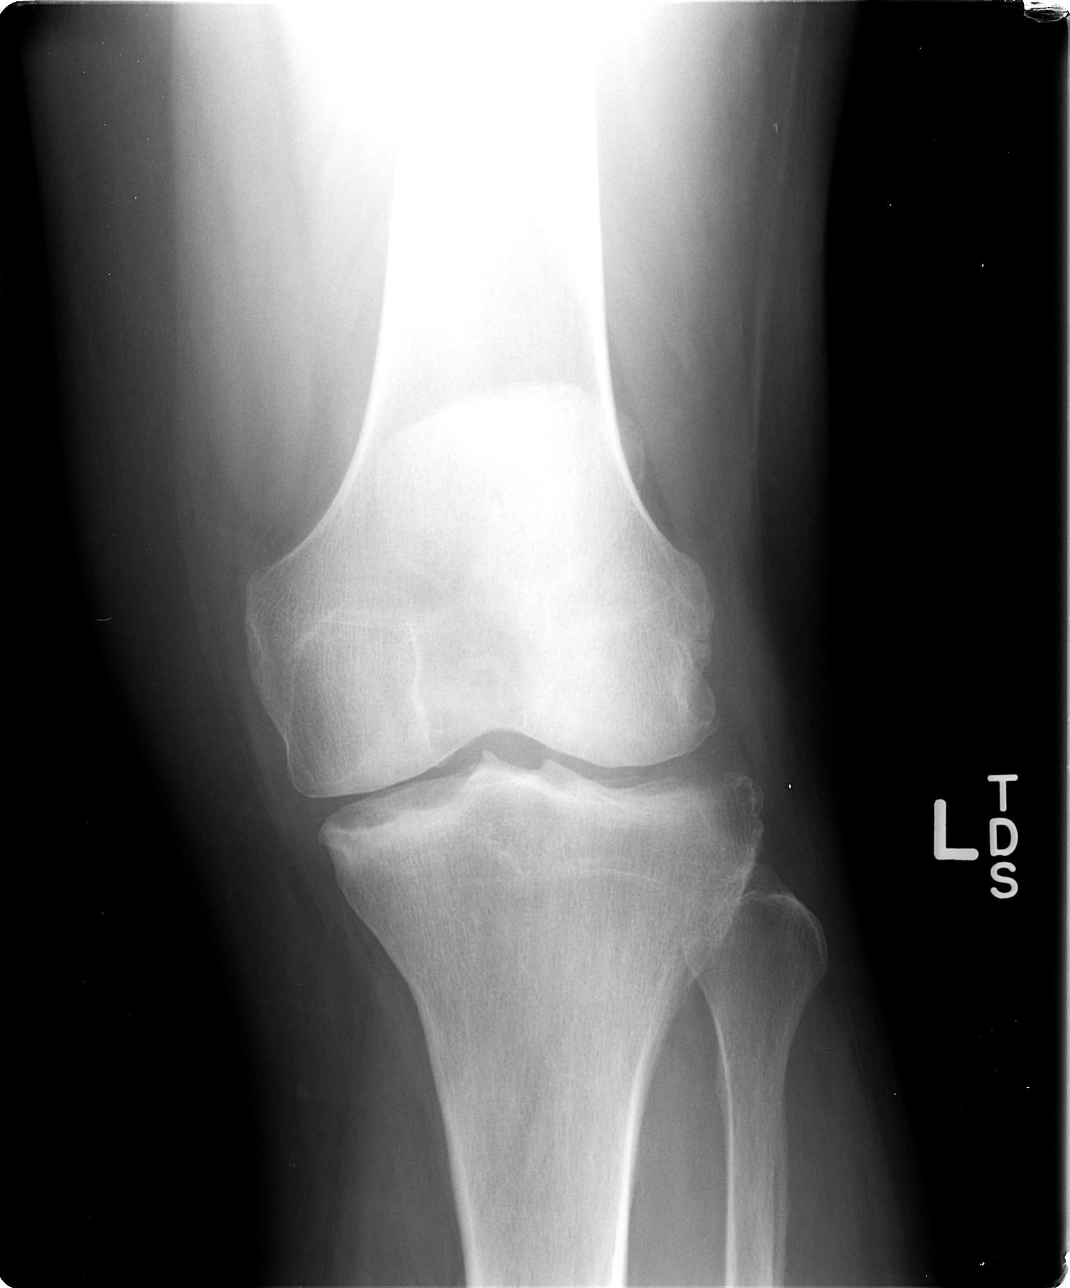

[view not recorded (2 of 4)]
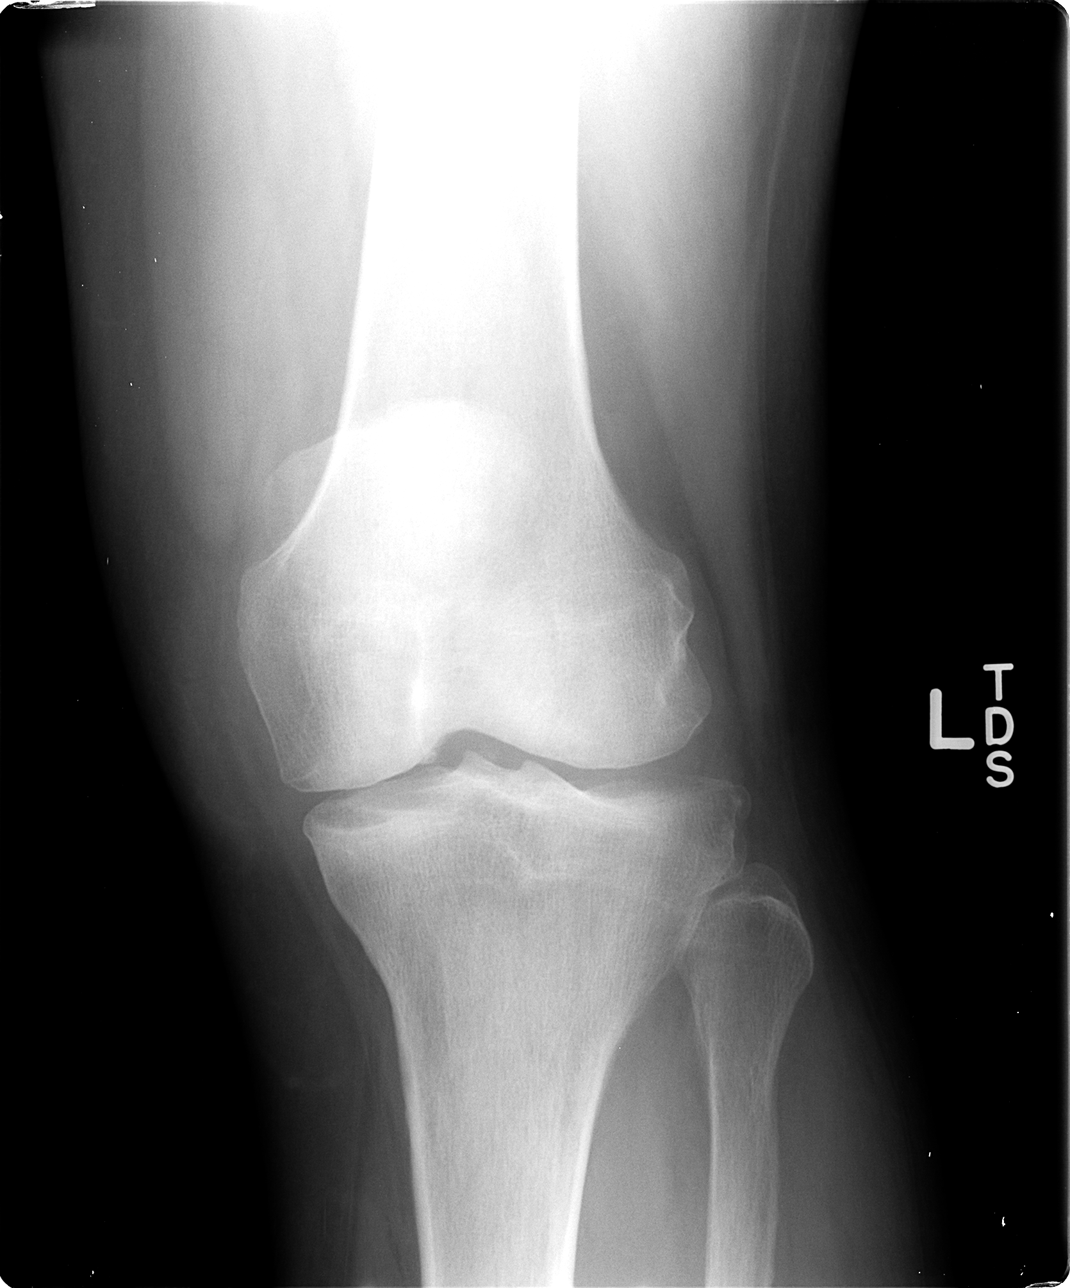

[view not recorded (3 of 4)]
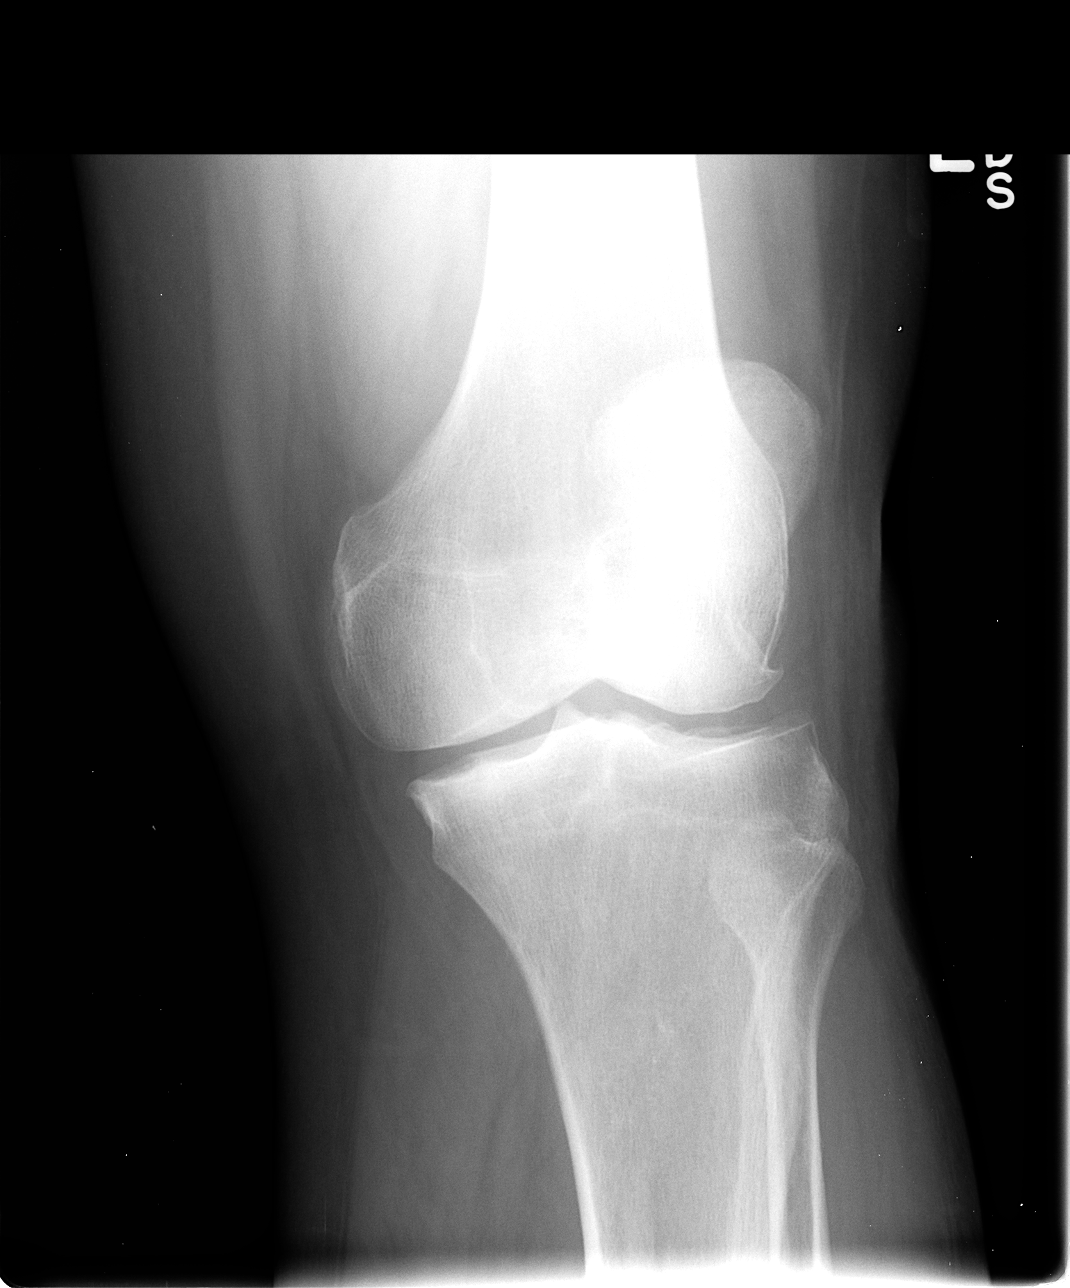

[view not recorded (4 of 4)]
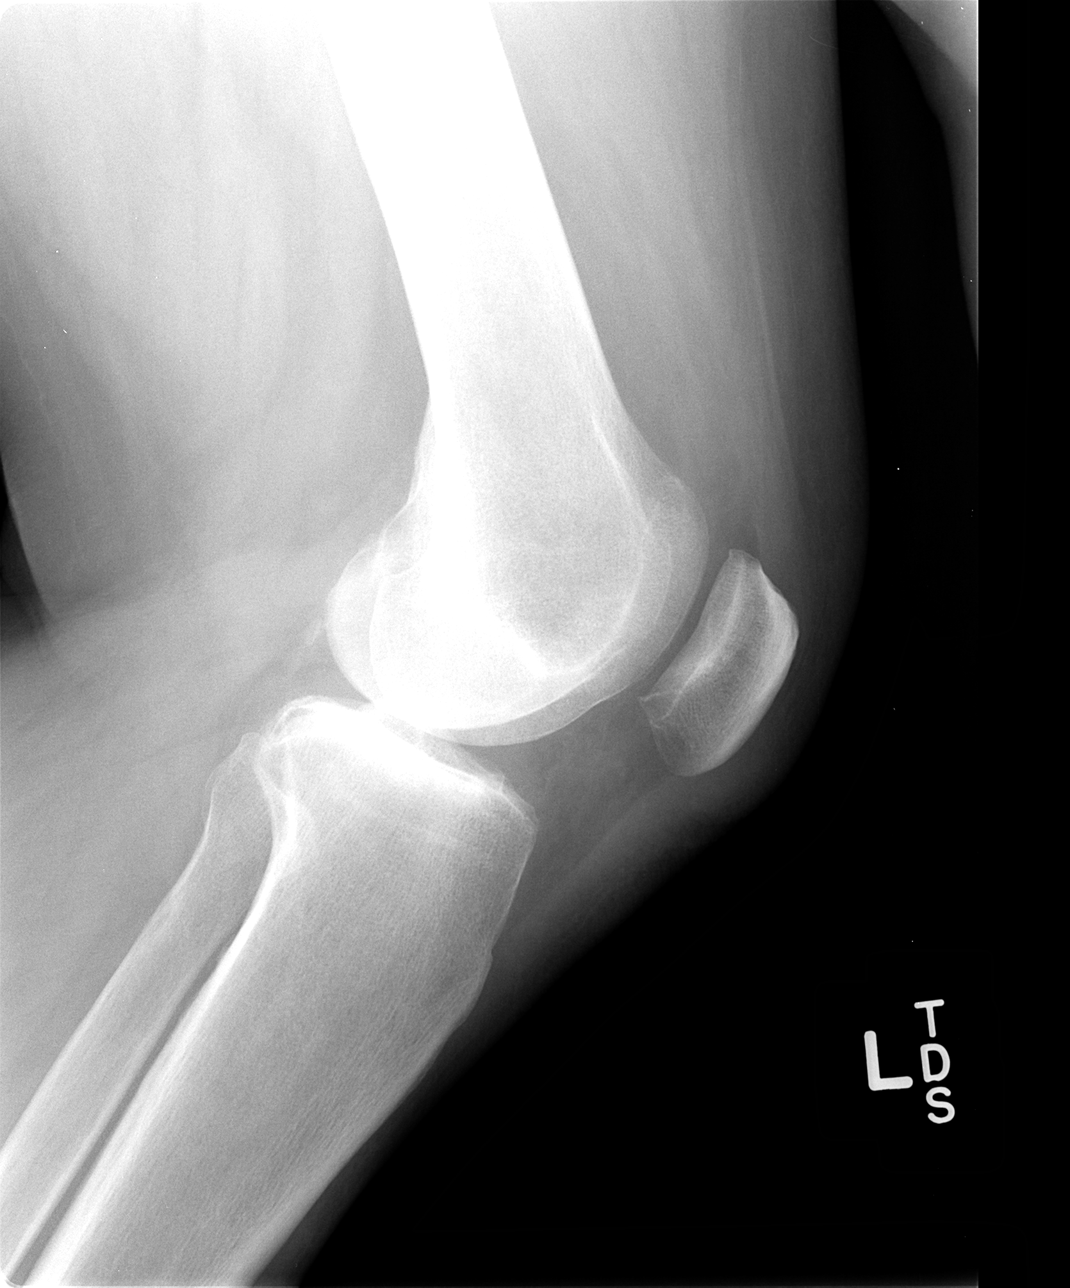

[4 of 4 positions shown; findings below may reference images not displayed]

FINDINGS: No fracture or dislocation is noted.  No joint effusion
is noted.  Mild osteophyte formation is seen laterally.  Mild
spurring of tibial spines is noted.
IMPRESSION: Mild degenerative joint disease is noted.  No acute abnormality
seen in the left knee.

## 2015-10-15 ENCOUNTER — Inpatient Hospital Stay
Admission: RE | Admit: 2015-10-15 | Discharge: 2015-10-31 | Disposition: A | Discharge status: Home / Self Care | Payer: 59 | Source: Ambulatory Visit | Attending: Internal Medicine | Admitting: Internal Medicine

## 2015-10-16 ENCOUNTER — Encounter (HOSPITAL_COMMUNITY)
Admission: AD | Admit: 2015-10-16 | Discharge: 2015-10-16 | Disposition: A | Discharge status: Home / Self Care | Payer: 59 | Source: Skilled Nursing Facility | Attending: Internal Medicine | Admitting: Internal Medicine

## 2015-10-16 ENCOUNTER — Non-Acute Institutional Stay (SKILLED_NURSING_FACILITY): Payer: 59 | Admitting: Internal Medicine

## 2015-10-16 DIAGNOSIS — I10 Essential (primary) hypertension: Secondary | ICD-10-CM | POA: Diagnosis present

## 2015-10-16 DIAGNOSIS — D509 Iron deficiency anemia, unspecified: Secondary | ICD-10-CM | POA: Diagnosis present

## 2015-10-16 DIAGNOSIS — E875 Hyperkalemia: Secondary | ICD-10-CM

## 2015-10-16 DIAGNOSIS — K8689 Other specified diseases of pancreas: Secondary | ICD-10-CM

## 2015-10-16 DIAGNOSIS — M10062 Idiopathic gout, left knee: Secondary | ICD-10-CM

## 2015-10-16 DIAGNOSIS — E119 Type 2 diabetes mellitus without complications: Secondary | ICD-10-CM | POA: Insufficient documentation

## 2015-10-16 DIAGNOSIS — K858 Other acute pancreatitis without necrosis or infection: Secondary | ICD-10-CM

## 2015-10-16 DIAGNOSIS — N170 Acute kidney failure with tubular necrosis: Secondary | ICD-10-CM | POA: Diagnosis not present

## 2015-10-16 DIAGNOSIS — M109 Gout, unspecified: Secondary | ICD-10-CM

## 2015-10-16 DIAGNOSIS — K869 Disease of pancreas, unspecified: Secondary | ICD-10-CM | POA: Diagnosis not present

## 2015-10-16 LAB — CBC
HEMATOCRIT: 22.6 % — AB (ref 39.0–52.0)
HEMOGLOBIN: 7.5 g/dL — AB (ref 13.0–17.0)
MCH: 33.6 pg (ref 26.0–34.0)
MCHC: 33.2 g/dL (ref 30.0–36.0)
MCV: 101.3 fL — ABNORMAL HIGH (ref 78.0–100.0)
Platelets: 381 10*3/uL (ref 150–400)
RBC: 2.23 MIL/uL — AB (ref 4.22–5.81)
RDW: 13.9 % (ref 11.5–15.5)
WBC: 14.2 10*3/uL — ABNORMAL HIGH (ref 4.0–10.5)

## 2015-10-16 LAB — BASIC METABOLIC PANEL
ANION GAP: 9 (ref 5–15)
BUN: 21 mg/dL — ABNORMAL HIGH (ref 6–20)
CALCIUM: 8.5 mg/dL — AB (ref 8.9–10.3)
CO2: 32 mmol/L (ref 22–32)
Chloride: 91 mmol/L — ABNORMAL LOW (ref 101–111)
Creatinine, Ser: 1.14 mg/dL (ref 0.61–1.24)
GLUCOSE: 110 mg/dL — AB (ref 65–99)
POTASSIUM: 4.1 mmol/L (ref 3.5–5.1)
Sodium: 132 mmol/L — ABNORMAL LOW (ref 135–145)

## 2015-10-16 LAB — URIC ACID: URIC ACID, SERUM: 9.3 mg/dL — AB (ref 4.4–7.6)

## 2015-10-16 NOTE — Progress Notes (Signed)
Patient ID: Edwin Thompson, male   DOB: [birth year], 54 y.o.   MRN: [id number]     facility; [facility] Chief complaint;admission to SNF post [facility] at [facility] through 12/27 History; this patient underwent a medically complex admission to hospital which star. He apparently presented with a 2-4 week history of epigastric pain radiating through to the backoral intake He took some ibuprofen at home. States he drinks 2 routine sized alcohol drinks per day. In the hospital he was discover a BUN of 122 a potassium of 6.4 and a sodium of 122 He was given isotonic  He had EKG changes related to hyperkalemia. He saw cardiology who did not recommend further evaluation as he apparently had had a catheterization and echo 2 months previously with no obstructive coronary artery disease.His lipase on arril was 393 and his liver function tests were elevated.CT scan suggested a large 8 cm masslike area at the junction of the pancreas and duodenum. Her was no pancreatic or biliary duct dilation. He was suspected to have portacaval lymphadenopathy His differential diagnosis was felt to represent duodenal tumor, pancreatic uncinate tumor, atypical acute pancreatitis. He had left lower lobe pneumonia and he was treated with antibiotics.  He was transferred to [facility] for evaluation of the pancreatic mass he underwent an endoscopic ultrasound with fine needle aspirate on December 12which was negative for maliancyand instead showed findings consistent with pancreatitis. The plan is to repeat the endoscopic ultrasound with fine-needle aspirate in January 2017 as an outpatient.His CA 19-9was slightly evated at 58.d peripancreatic stranding which could represent pancreatitis or underlying mass. Some point during this hospitalization the patient states he developed lower extremity swelling. He was given Lasix, low sodium and fluid restrictions. He developed blistering in his lower extremities. As  again he was treated for hyperkalemia on December 19 with calcium gluconate andKayexalate nephrology started him on IV Bumex.his creatinine seems to have remarkably stabilized. On 12/24his sodium was127, potassiu, CO2 28 BUN 31 and creatinine 1.3. On 12/19 his AST was 33lkaline phosphatase 163 and albumin 2.5. On 12/23 his TSH was 12.32.  He was worked up above for venous thromboembolic disease. A lower extremity Doppler on 12/10 showed no DVT in the right leg.A V/Q scan was negative for PE on 12/10and abdominal ultrasound showed an enlarged fatty liver. As noted above is PET scan was not definitive. He had peripancreatic stranding with diffuse pancreatic hypermetabolic Activity.There was no evidence of hypermetabolic metastatic disease.  BMP Latest Ref Rng 10/16/2015 08/06/2011 08/04/2011  Glucose 65 - 99 mg/dL 409(W) 119(J) 478(G)  BUN 6 - 20 mg/dL 95(A) 6 18  Creatinine 0.61 - 1.24 mg/dL 2.13 0.86 5.78  Sodium 135 - 145 mmol/L 132(L) 137 140  Potassium 3.5 - 5.1 mmol/L 4.1 4.5 4.2  Chloride 101 - 111 mmol/L 91(L) 98 104  CO2 22 - 32 mmol/L 32 32 28  Calcium 8.9 - 10.3 mg/dL 4.6(N) 9.6 9.1   CBC Latest Ref Rng 10/16/2015 08/05/2011 08/04/2011  WBC 4.0 - 10.5 K/uL 14.2(H) 11.6(H) 11.0(H)  Hemoglobin 13.0 - 17.0 g/dL 7.5(L) 12.4(L) 12.1(L)  Hematocrit 39.0 - 52.0 % 22.6(L) 38.3(L) 37.3(L)  Platelets 150 - 400 K/uL 381 352 540(H)    Past medical history/problem list #1 acute renal failure improved #2 treated 2 times for hyperkalemia including one time with EKG changes [widened QRS complex] #3 type 2 diabetes on metformin as an outpatient #4 hypertension #5back pain #6 patient states he has a history of gout for 2 or  3 yearsand has had about for acute attacks in that timeframe. 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 #9he only admits to 2 hard liquor drinks per day #10 States he has kidney stones not exactly sure whattype #11   Past Surgical History  Procedure Laterality Date  . Lithotripsy    . Carpel tunnel     Medications Duplex 10 mg when necessary Synthroid 25 dailyC codon 5 mg every 8when necessary MiraLAX 17 g daily Senokot-S 8.6/50 twice a day Aspirin 81 daily Lasix 20 daily Allopurinol 300 daily Allopurinol 300 daily Magnesium 400 daily Metformin 500 daily Prilosec 20 daily KCl10 daily Vitamin D thousand units daily   Social: [place]. 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 uit smoking 17 years ago.2 hard liquor drinks per day.  Family history; none related by the patient  Review of sytems:  General: patient states he feels better his appetite is improved no fever or chills HEENT no headache no history of scleral icterus he is aware of Respiratory; occasional no shortness of breath did not use oxygen at home Cardiac no clear exertional chest pain. He is still having epigastric pain that radiates through to his back. GI;epigastric pain as noted.States he is having semi-formed bowel movements no diarrhea. No history of jaundice GU has flank painbut no dysuria or hematuria Musculoskeletalhe complains of pain in his feet and ankles and knees. Skin; erythema of both legsstarted in the hospital per the patient. He normally does not have lower extremity edema or erythema. He still has blisters mostly in his dorsal feet Neurologic; he does not complain of weakness feels generally weak but he feels he is improving Mental status; no overt complaints Endocrine patient was on metformin at home  Physical examiation Gen. Patient looks remarkably well considering the complexity of his hospitalization HEENT; o scleral icterus mouth and throat no Lymph nonpalpable in the cervical clavicular or axillary  areas Respiratoryfew crackles in the left lower lobe right lung is clear there is no wheezing Cardiac heart sounds are normal no gallops JVP is not elevated Abdomen;abdomen is distended bowel sounds are positive there is no clear shifting dullness no liver no spleen no stigmata. E does have lower abdominal pannus with lymphedema GU; he has no suprapubic tenderness but he does have bilateralCVA tenderness especially on the left Extremities there is edema in both legs especially in the dorsal aspect of his foot. Light erythep both legs w patient states is new he has several ruptured blisters and several intact blisters especially in his feet Musculoskeletal; it is difficult to be certain as the patient jumps and a lot of locations when you touch him however I would say he has active synovitis across his metatarsal phalangeal joints bilaterallyseveral of the small joints in his toes his ankle and especially the left knee. There is an effusion in the left knee Neurologic; a quick screening exam is negative he does have antigravity strength. Reflexes symmetric at the kneesplantar responses flexor Mental status; I see no abnormalities here  Impression/plan #1 pancreatic [facility]with [facility]. I am left with a feelingthat he either has an undiagnosed pancreatic tumor or some form of atypical[autoimmune pancreatitis} he is supposed to go  back to [facility] January We'll need to research who is supposed to be doing this. #2 acute renal failure which appears to have resolved prerenal issues. lso felt to be NSAID use/diuretic use/poor by mouth intake #3treated 2 for hyperkalemia in the hospital this seems to have stabilized. #4 treated for left lower lobe pneumonia this appears to be stable O2 sat at 95% on 3 L. This can probably be tapered #5 patient has flank pain and CVA tenderness. He has a 14 mm nonobstructing renal calculusn CT scan.  urologist is Dr. Gwenyth Allegra. I would wonder if this is uric acid #6 probably acute goutin his lower extremities. Had some thought of doing a needle aspiration of the left knee however in talking to him he apparently has had needle aspirates secondary to gout. I'm probably going to treat him with steroids #7 bilateral lower extremity erythema with blistering. I do not believe that this is cellulitis although I'm going to need to follow this carefully. Bilateral cellulitis is rare but not unheard of. He may also have unrecognizedvenous insufficiency with inflammation.  This will be a difficultpatient to manage. He will need to see GI again in the new year and we will research ho his GI doctor is. His renal function seems to have stabilized. He comes to [place] off diuretics, but nor do I currently think any are necessary. He will requirelower extremity wraps probably Profore light. I will check a uric acid level on him however I'm not planning to aspirate his knee

## 2015-10-23 ENCOUNTER — Non-Acute Institutional Stay (SKILLED_NURSING_FACILITY): Payer: BLUE CROSS/BLUE SHIELD | Admitting: Internal Medicine

## 2015-10-23 ENCOUNTER — Encounter (HOSPITAL_COMMUNITY)
Admission: AD | Admit: 2015-10-23 | Discharge: 2015-10-23 | Disposition: A | Discharge status: Home / Self Care | Payer: BLUE CROSS/BLUE SHIELD | Source: Skilled Nursing Facility | Attending: Internal Medicine | Admitting: Internal Medicine

## 2015-10-23 DIAGNOSIS — M109 Gout, unspecified: Secondary | ICD-10-CM

## 2015-10-23 DIAGNOSIS — M10062 Idiopathic gout, left knee: Secondary | ICD-10-CM | POA: Diagnosis not present

## 2015-10-23 DIAGNOSIS — N170 Acute kidney failure with tubular necrosis: Secondary | ICD-10-CM | POA: Diagnosis not present

## 2015-10-23 DIAGNOSIS — K858 Other acute pancreatitis without necrosis or infection: Secondary | ICD-10-CM

## 2015-10-23 LAB — LIPASE, BLOOD: LIPASE: 42 U/L (ref 11–51)

## 2015-10-23 LAB — COMPREHENSIVE METABOLIC PANEL
ALK PHOS: 72 U/L (ref 38–126)
ALT: 45 U/L (ref 17–63)
AST: 50 U/L — AB (ref 15–41)
Albumin: 2.9 g/dL — ABNORMAL LOW (ref 3.5–5.0)
Anion gap: 8 (ref 5–15)
BUN: 21 mg/dL — AB (ref 6–20)
CALCIUM: 9.6 mg/dL (ref 8.9–10.3)
CHLORIDE: 98 mmol/L — AB (ref 101–111)
CO2: 29 mmol/L (ref 22–32)
CREATININE: 0.98 mg/dL (ref 0.61–1.24)
Glucose, Bld: 102 mg/dL — ABNORMAL HIGH (ref 65–99)
Potassium: 4 mmol/L (ref 3.5–5.1)
SODIUM: 135 mmol/L (ref 135–145)
Total Bilirubin: 0.6 mg/dL (ref 0.3–1.2)
Total Protein: 6.6 g/dL (ref 6.5–8.1)

## 2015-10-23 LAB — URINALYSIS, ROUTINE W REFLEX MICROSCOPIC
BILIRUBIN URINE: NEGATIVE
GLUCOSE, UA: NEGATIVE mg/dL
HGB URINE DIPSTICK: NEGATIVE
KETONES UR: NEGATIVE mg/dL
LEUKOCYTES UA: NEGATIVE
Nitrite: NEGATIVE
PH: 6 (ref 5.0–8.0)
PROTEIN: NEGATIVE mg/dL
Specific Gravity, Urine: 1.01 (ref 1.005–1.030)

## 2015-10-23 LAB — CBC
HCT: 25.9 % — ABNORMAL LOW (ref 39.0–52.0)
Hemoglobin: 8.3 g/dL — ABNORMAL LOW (ref 13.0–17.0)
MCH: 32.7 pg (ref 26.0–34.0)
MCHC: 32 g/dL (ref 30.0–36.0)
MCV: 102 fL — AB (ref 78.0–100.0)
PLATELETS: 297 10*3/uL (ref 150–400)
RBC: 2.54 MIL/uL — AB (ref 4.22–5.81)
RDW: 14.1 % (ref 11.5–15.5)
WBC: 9.8 10*3/uL (ref 4.0–10.5)

## 2015-10-24 NOTE — Progress Notes (Addendum)
Patient ID: Edwin Thompson, male   DOB: Sep 14, 1961, 55 y.o.   MRN: [id number]                PROGRESS NOTE  DATE:  10/23/2015           FACILITY: [facility]                  LEVEL OF CARE:   SNF   Acute Visit                      CHIEF COMPLAINT:  Follow up medical issues including lower extremity edema, acute gout, etc.      HISTORY OF PRESENT ILLNESS:  This is a patient who came to [place] from [facility].  He had presented to hospital for evaluation of a pancreatic mass that was symptomatic, including increasing LFTs with a CT scan suggestive of an 8 cm mass-like area in the head of his pancreas.  His lipase on arrival was 393.  His differential diagnosis was felt to represent a pancreatic uncinate tumor, duodenal tumor, or an acute atypical pancreatitis.  He had an endoscopic ultrasound with fine needle aspirate on December 12th, which was negative for malignancy; instead, showed findings consistent with pancreatitis.  He is still supposed to go there next Wednesday for a repeat endoscopic ultrasound and biopsy.    During the hospitalization, he developed blistering in his lower extremities.    He was treated for hyperkalemia on two different occasions.    He had a negative work-up for venothromboembolic disease.    An abdominal ultrasound showed a fatty liver.  PET scan was not definitive.      When I did his admission, I felt he had possibly acute gout across his metatarsophalangeals, several of his PIPs in his feet, and particularly his left knee.  I gave him prednisone and this seems to have improved quite a bit.  I did a uric acid level on him, which was 9.3.  He is on allopurinol at 300 mg a day.  He tells me that he has had previous joint aspirations which were diagnostic for gout, I think out of his right knee previously.    LABORATORY DATA:   Lab work from today:    Potassium 4, CO2 of 29, BUN 21, creatinine 0.98.    White count 9.8, hemoglobin 8.3.     Lipase level within normal limits at 42.    Liver function tests:  AST 50, ALT 45, alk phos normal at 72, bilirubin normal at 0.6.    Albumin 2.9.    REVIEW OF SYSTEMS:    CHEST/RESPIRATORY:  No shortness of breath.   CARDIAC:  No chest pain.   GI:  Absolutely no abdominal pain.   No nausea, vomiting, or diarrhea is noted.   GU:  He complains of a vague left flank pain which comes and goes, sometimes lasting up to an hour.  He has a known kidney stone on this side.  I think he was seen by Urology.  I will see if we can arrange an appointment in follow-up here.  He has not noted any hematuria or dysuria.  There are no irritative voiding symptoms.   MUSCULOSKELETAL:  Extremities:  As noted, pain in his feet is much better.  This has not stopped him from ambulating.   DERMATOLOGY:  Skin on his left leg seems a lot better.  I gave him topical TCA, ABD pads, and Profore  Lite.  Everything looks like it has closed up.       PHYSICAL EXAMINATION:   GENERAL APPEARANCE:  The patient appears very well.   CHEST/RESPIRATORY:  Clear air entry bilaterally.    CARDIOVASCULAR:   CARDIAC:  Heart sounds are normal.  Euvolemic.   GASTROINTESTINAL:   ABDOMEN:  Distended, but bowel sounds are positive.   LIVER/SPLEEN/KIDNEYS:  No liver, no spleen.  No tenderness.     GENITOURINARY:   BLADDER:  He has no suprapubic or costovertebral angle tenderness.   This is an improvement from the other day.   CIRCULATION:   EDEMA/VARICOSITIES:  Extremities:  The edema in his legs is now controlled.   SKIN:   INSPECTION:  There are no ruptured blisters.   MUSCULOSKELETAL:   EXTREMITIES:   BILATERAL LOWER EXTREMITIES:  The pain across his metatarsophalangeals and several of his PIPs seems improved since I gave him prednisone, starting on 10/17/2015.    ASSESSMENT/PLAN:           Acute gout.  I am going to continue the prednisone, start him on colchicine, and increase his allopurinol.         Hyperkalemia, acute  renal failure.  This appears to have resolved.    Pancreatitis: hopefully this is th case vs a malignancy. ?autoimmune pancreatitis   CPT CODE: 35825

## 2015-10-28 ENCOUNTER — Encounter (HOSPITAL_COMMUNITY)
Admission: RE | Admit: 2015-10-28 | Discharge: 2015-10-28 | Disposition: A | Discharge status: Home / Self Care | Payer: BLUE CROSS/BLUE SHIELD | Source: Skilled Nursing Facility | Attending: Internal Medicine | Admitting: Internal Medicine

## 2015-10-28 LAB — COMPREHENSIVE METABOLIC PANEL
ALBUMIN: 3.5 g/dL (ref 3.5–5.0)
ALK PHOS: 81 U/L (ref 38–126)
ALT: 36 U/L (ref 17–63)
ANION GAP: 10 (ref 5–15)
AST: 37 U/L (ref 15–41)
BILIRUBIN TOTAL: 0.6 mg/dL (ref 0.3–1.2)
BUN: 31 mg/dL — AB (ref 6–20)
CALCIUM: 10 mg/dL (ref 8.9–10.3)
CO2: 29 mmol/L (ref 22–32)
CREATININE: 1.05 mg/dL (ref 0.61–1.24)
Chloride: 100 mmol/L — ABNORMAL LOW (ref 101–111)
GFR calc Af Amer: >60 mL/min (ref >60)
GFR calc non Af Amer: >60 mL/min (ref >60)
GLUCOSE: 96 mg/dL (ref 65–99)
Potassium: 3.6 mmol/L (ref 3.5–5.1)
SODIUM: 139 mmol/L (ref 135–145)
Total Protein: 7.4 g/dL (ref 6.5–8.1)

## 2015-10-28 LAB — CBC
HEMATOCRIT: 30.5 % — AB (ref 39.0–52.0)
HEMOGLOBIN: 9.6 g/dL — AB (ref 13.0–17.0)
MCH: 32.5 pg (ref 26.0–34.0)
MCHC: 31.5 g/dL (ref 30.0–36.0)
MCV: 103.4 fL — ABNORMAL HIGH (ref 78.0–100.0)
Platelets: 339 10*3/uL (ref 150–400)
RBC: 2.95 MIL/uL — AB (ref 4.22–5.81)
RDW: 14.2 % (ref 11.5–15.5)
WBC: 11.6 10*3/uL — ABNORMAL HIGH (ref 4.0–10.5)

## 2015-10-29 ENCOUNTER — Encounter: Payer: Self-pay | Admitting: Internal Medicine

## 2015-10-29 ENCOUNTER — Non-Acute Institutional Stay (SKILLED_NURSING_FACILITY): Payer: BLUE CROSS/BLUE SHIELD | Admitting: Internal Medicine

## 2015-10-29 DIAGNOSIS — E118 Type 2 diabetes mellitus with unspecified complications: Secondary | ICD-10-CM

## 2015-10-29 DIAGNOSIS — K859 Acute pancreatitis without necrosis or infection, unspecified: Secondary | ICD-10-CM | POA: Diagnosis not present

## 2015-10-29 DIAGNOSIS — M1000 Idiopathic gout, unspecified site: Secondary | ICD-10-CM | POA: Diagnosis not present

## 2015-10-29 DIAGNOSIS — N179 Acute kidney failure, unspecified: Secondary | ICD-10-CM | POA: Diagnosis not present

## 2015-10-29 NOTE — Progress Notes (Signed)
Patient ID: Edwin Thompson, male   DOB: 21-Aug-1961, 55 y.o.   MRN: [id number]       facility; [facility] This is a discharge note  Chief complaint--discharge note    HPI  ; this patient underwent a medically complex admission to hospitalr. He apparently presented with a 2-4 week history of epigastric pain radiating through to the backoral intake He took some ibuprofen at home. States he drinks 2 routine sized alcohol drinks per day. In the hospital he was discover a BUN of 122 a potassium of 6.4 and a sodium of 122 He was given isotonic  He had EKG changes related to hyperkalemia. He saw cardiology who did not recommend further evaluation as he apparently had had a catheterization and echo 2 months previously with no obstructive coronary artery disease.His lipase  was 393 and his liver function tests were elevated.CT scan suggested a large 8 cm masslike area at the junction of the pancreas and duodenum. Her was no pancreatic or biliary duct dilation. He was suspected to have portacaval lymphadenopathy His differential diagnosis was felt to represent duodenal tumor, pancreatic uncinate tumor, atypical acute pancreatitis. He had left lower lobe pneumonia and he was treated with antibiotics.  He was transferred to [facility] for evaluation of the pancreatic mass he underwent an endoscopic ultrasound with fine needle aspirate on December 12which was negative for maliancyand instead showed findings consistent with pancreatitis. The plan is to repeat the endoscopic ultrasound with fine-needle aspirate and that is actually scheduled for tomorrow as an outpatien  . Some point during this hospitalization the patient states he developed lower extremity swelling. He was given Lasix, low sodium and fluid restrictions. He developed blistering in his lower extremities. As again he was treated for hyperkalemia on December 19 with calcium gluconate andKayexalate nephrology started him on IV Bumex.his  creatinine seems to have remarkably stabilized.  On January 9 his creatinine was 1.05 BUN 31 potassium was 3.6.  He was worked up above for venous thromboembolic diseasein the hospital--. A lower extremity Doppler on 12/10 showed no DVT in the right leg.A V/Q scan was negative for PE on 12/10and abdominal ultrasound showed an enlarged fatty liver. As noted above is PET scan was not definitive. He had peripancreatic stranding with diffuse pancreatic hypermetabolic Activity.There was no evidence of hypermetabolic metastatic disease  His stay here has been complicated by suspected gout attack which he has responded well to the colchicine as well as allopurinol and prednisone.  He is now ambulatory appears to be doing relatively well will need expedient follow-up for his numerous medical issues  He does live alone he will need outpatient therapy  Patient does state that he had a small amount of blood  in his stool after he had a difficult BM yesterday he feels this came from straining  .  Labs.  10/28/2015.  Sodium 139 potassium 3.6 BUN 31 creatinine 1.05.  Liver function tests are normal limits.  WBC 11.6 hemoglobin 9.6 platelets 339  BMP Latest Ref Rng 10/16/2015 08/06/2011 08/04/2011  Glucose 65 - 99 mg/dL 409(W) 119(J) 478(G)  BUN 6 - 20 mg/dL 95(A) 6 18  Creatinine 0.61 - 1.24 mg/dL 2.13 0.86 5.78  Sodium 135 - 145 mmol/L 132(L) 137 140  Potassium 3.5 - 5.1 mmol/L 4.1 4.5 4.2  Chloride 101 - 111 mmol/L 91(L) 98 104  CO2 22 - 32 mmol/L 32 32 28  Calcium 8.9 - 10.3 mg/dL 4.6(N) 9.6 9.1   CBC Latest Ref Rng  10/16/2015 08/05/2011 08/04/2011  WBC 4.0 - 10.5 K/uL 14.2(H) 11.6(H) 11.0(H)  Hemoglobin 13.0 - 17.0 g/dL 7.5(L) 12.4(L) 12.1(L)  Hematocrit 39.0 - 52.0 % 22.6(L) 38.3(L) 37.3(L)  Platelets 150 - 400 K/uL 381 352 540(H)    Past medical history/problem list #1 acute renal failure improved--this is stable as with a creatinine of 1.05 on lab done January 9 #2 treated 2  times for hyperkalemia including one time with EKG changes [widened QRS complex--potassium has been normalized now for some time at 3.6 on lab done yesterday] #3 type 2 diabetes on metformin as an outpatient--CBGs run from 89 up to 173 #4 hypertension--this appears relatively stable recent blood pressures 109/72-135/83  #5 patient states he has a history of gout for 2 or 3 yearsand has had about for acute attacks in that timeframe. He is on allopurinol and apparently was on colchicine but stopped that when the price went up several years ago--he has been restarted on the closest seen secondary to recent gout attack-apparently there may be insurance issues with getting this at home this will have to be determined  #6 mild leukocytosis with white count 11.6 on lab done yesterday we will update this before discharge he does not complain of any increased cough congestion or dysuria   Past Surgical History  Procedure Laterality Date  . Lithotripsy    . Carpel tunnel     Medications Duplex 10 mg when necessary Synthroid 25 dailyC codon 5 mg every 8when necessary MiraLAX 17 g daily Senokot-S 8.6/50 twice a day Aspirin 81 daily Lasix 20 daily Allopurinol 300 daily  Magnesium 400 daily Metformin 500 daily Prilosec 20 daily KCl10 daily Vitamin D thousand units daily Colchicine 0.6 mg twice a day.     [place]: [place] 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 uit smoking 17 years ago.2 hard liquor drinks per day.  Family history; none related by the patient  Review of sytems:  General: patient states he feels better his appetite is improved no fever or chills HEENT no headache no history of scleral icterus he is aware of Respiratory; occasional no shortness of breath did not use oxygen at home Cardiac no clear exertional chest pain. Marland Kitchen. GI Is not really complaining of epigastric pain today has complained of this in the past GU has flank painbut no dysuria or  hematuria Musculoskeletalhe complains of pain in his feet and ankles and knees at times with this appears to be relatively well controlled. Skin; e Leg erythema has improved with topical treatment recommendation by nursing to DC the leg wraps and start TED hose Neurologic; he does not complain of weakness feels generally weak but he feels he is improving Mental status; no overt complaints Endocrine patient was on metformin at home  Physical examiation Temperature 97.5 pulse 77 respirations 18 blood pressure 109/72 Gen. Patient looks remarkably well considering the complexity of his hospitalization HEENT; pharynx clear mucous membranes moist  Respiratory--to auscultation with no labored breathing Cardiac heart sounds are normal no gallops JVP is not elevated Abdomen;abdomen is distended bowel sounds are positive there is no clear shifting dullness  GU; he has no suprapubic tenderness  Rectal-digital exam appear to be occult negative for blood Extremities there is edema in both legs Legs are currently wrapped apparently the erythema is improving with topical treatment per nursing Musculoskeletal;  Moves all extremities 4 he does ambulate with a cane now-does have some tenderness to palpation of his toes bilaterally but apparently this has significantly  improved Neurologic; no lateralizing findings speech is clear appears grossly intact Mental status; I see no abnormalities here pleasant and appropriate  Impression/plan #1 pancreatic masswith an extensive workup done at [facility] without clear diagnosis. --Per assessment by Dr. 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 lso felt to be NSAID use/diuretic use/poor by mouth intake--this has stabilized as noted  above with a creatinine 1.05 on lab done yesterday #3treated 2 for hyperkalemia in the hospital this seems to have stabilized. #4 treated for left lower lobe pneumonia this appears to be stable- #5 patient has had flank pain. He has a 14 mm nonobstructing renal calculusn CT scan. urologist is Dr. Gwenyth Allegra. #6 probably acute goutin his lower extremities This has improved as noted above he continues on allopurinol and colchicine he has completed prednisone as well #7 bilateral lower extremity erythema with blistering.  Currently legs are wrapped apparently this has improved per wound care suggested nystatin discontinued leg wraps and changed to O.  #8-episode of rectal bleeding yesterday apparently this was associated with a difficult bowel movement-will order stools to be tested for blood however and if positive will need follow-up by primary care provider  Patient will be going home he is independent he will need outpatient therapy as well as follow-up as noted above.  [id number] note greater than 30 minutes spent on this discharge summary-greater than 50% of time spent coordinating plan of care for numerous diagnoses

## 2015-10-30 ENCOUNTER — Encounter (HOSPITAL_COMMUNITY)
Admission: AD | Admit: 2015-10-30 | Discharge: 2015-10-30 | Disposition: A | Discharge status: Home / Self Care | Payer: BLUE CROSS/BLUE SHIELD | Source: Skilled Nursing Facility | Attending: Internal Medicine | Admitting: Internal Medicine

## 2015-10-30 LAB — OCCULT BLOOD X 1 CARD TO LAB, STOOL: Fecal Occult Bld: POSITIVE — AB

## 2015-10-31 ENCOUNTER — Encounter (HOSPITAL_COMMUNITY)
Admission: AD | Admit: 2015-10-31 | Discharge: 2015-10-31 | Disposition: A | Discharge status: Home / Self Care | Payer: BLUE CROSS/BLUE SHIELD | Source: Skilled Nursing Facility | Attending: Internal Medicine | Admitting: Internal Medicine

## 2015-10-31 LAB — CBC WITH DIFFERENTIAL/PLATELET
BASOS ABS: 0 10*3/uL (ref 0.0–0.1)
BASOS PCT: 0 %
Eosinophils Absolute: 0 10*3/uL (ref 0.0–0.7)
Eosinophils Relative: 0 %
HEMATOCRIT: 28.3 % — AB (ref 39.0–52.0)
HEMOGLOBIN: 9.1 g/dL — AB (ref 13.0–17.0)
LYMPHS PCT: 12 %
Lymphs Abs: 1.2 10*3/uL (ref 0.7–4.0)
MCH: 32.7 pg (ref 26.0–34.0)
MCHC: 32.2 g/dL (ref 30.0–36.0)
MCV: 101.8 fL — ABNORMAL HIGH (ref 78.0–100.0)
MONO ABS: 0.8 10*3/uL (ref 0.1–1.0)
Monocytes Relative: 8 %
Neutro Abs: 8 10*3/uL — ABNORMAL HIGH (ref 1.7–7.7)
Neutrophils Relative %: 80 %
Platelets: 299 10*3/uL (ref 150–400)
RBC: 2.78 MIL/uL — AB (ref 4.22–5.81)
RDW: 14 % (ref 11.5–15.5)
WBC: 9.9 10*3/uL (ref 4.0–10.5)

## 2015-11-01 ENCOUNTER — Encounter: Payer: Self-pay | Admitting: Internal Medicine

## 2015-11-01 NOTE — Progress Notes (Signed)
This encounter was created in error - please disregard.

## 2015-12-18 DEATH — deceased
